# Patient Record
Sex: Female | Born: 1955 | Race: White | Hispanic: No | Marital: Married | Smoking: Never smoker
Health system: Southern US, Community
[De-identification: ages and names within clinical notes are randomized; demographics above are authoritative.]

## PROBLEM LIST (undated history)

## (undated) DIAGNOSIS — D649 Anemia, unspecified: Secondary | ICD-10-CM

## (undated) HISTORY — PX: TUBAL LIGATION: SHX77

## (undated) HISTORY — PX: ENDOMETRIAL ABLATION: SHX621

## (undated) HISTORY — PX: DIAGNOSTIC LAPAROSCOPY: SUR761

## (undated) HISTORY — PX: OVARIAN CYST REMOVAL: SHX89

---

## 2007-03-09 ENCOUNTER — Ambulatory Visit (HOSPITAL_COMMUNITY): Admission: RE | Admit: 2007-03-09 | Discharge: 2007-03-09 | Payer: Self-pay | Admitting: Internal Medicine

## 2007-03-30 ENCOUNTER — Encounter: Admission: RE | Admit: 2007-03-30 | Discharge: 2007-03-30 | Payer: Self-pay | Admitting: Internal Medicine

## 2007-03-30 ENCOUNTER — Encounter (INDEPENDENT_AMBULATORY_CARE_PROVIDER_SITE_OTHER): Payer: Self-pay | Admitting: Diagnostic Radiology

## 2007-07-08 ENCOUNTER — Encounter: Payer: Self-pay | Admitting: Gastroenterology

## 2007-07-08 ENCOUNTER — Ambulatory Visit: Payer: Self-pay | Admitting: Gastroenterology

## 2007-07-08 ENCOUNTER — Ambulatory Visit (HOSPITAL_COMMUNITY): Admission: RE | Admit: 2007-07-08 | Discharge: 2007-07-08 | Payer: Self-pay | Admitting: Gastroenterology

## 2007-08-03 ENCOUNTER — Ambulatory Visit (HOSPITAL_COMMUNITY): Admission: RE | Admit: 2007-08-03 | Discharge: 2007-08-03 | Payer: Self-pay | Admitting: Gastroenterology

## 2007-08-04 ENCOUNTER — Ambulatory Visit: Payer: Self-pay | Admitting: Gastroenterology

## 2007-10-05 ENCOUNTER — Ambulatory Visit (HOSPITAL_COMMUNITY): Admission: RE | Admit: 2007-10-05 | Discharge: 2007-10-05 | Payer: Self-pay | Admitting: Obstetrics and Gynecology

## 2007-10-16 ENCOUNTER — Encounter: Admission: RE | Admit: 2007-10-16 | Discharge: 2007-10-16 | Payer: Self-pay | Admitting: Internal Medicine

## 2008-03-18 ENCOUNTER — Encounter: Admission: RE | Admit: 2008-03-18 | Discharge: 2008-03-18 | Payer: Self-pay | Admitting: Internal Medicine

## 2009-03-22 ENCOUNTER — Ambulatory Visit (HOSPITAL_COMMUNITY): Admission: RE | Admit: 2009-03-22 | Discharge: 2009-03-22 | Payer: Self-pay | Admitting: Internal Medicine

## 2010-02-26 ENCOUNTER — Other Ambulatory Visit: Admission: RE | Admit: 2010-02-26 | Discharge: 2010-02-26 | Payer: Self-pay | Admitting: Obstetrics & Gynecology

## 2010-03-26 ENCOUNTER — Ambulatory Visit (HOSPITAL_COMMUNITY): Admission: RE | Admit: 2010-03-26 | Discharge: 2010-03-26 | Payer: Self-pay | Admitting: Obstetrics & Gynecology

## 2010-09-02 ENCOUNTER — Encounter: Payer: Self-pay | Admitting: Internal Medicine

## 2010-12-25 NOTE — Op Note (Signed)
NAME:  Crystal Duffy, Crystal Duffy              ACCOUNT NO.:  [id number]   MEDICAL RECORD NO.:  [id number]          PATIENT TYPE:  AMB   LOCATION:  DAY                           FACILITY:  [facility]   PHYSICIAN:  Kassie Mends, M.D.      DATE OF BIRTH:  Mar 07, 1956   DATE OF PROCEDURE:  07/08/2007  DATE OF DISCHARGE:                               OPERATIVE REPORT   PROCEDURE:  1. Colonoscopy.  2. Esophagogastroduodenoscopy with cold forceps biopsy.   INDICATION FOR EXAM:  Crystal Duffy is a 55 year old female, who  presents with microcytic anemia.  She denies weight-loss, hematemesis,  melena, or rectal bleeding.  She is not a vegan.  She denies any  dysphagia, heartburn, or abdominal pain.  She rarely uses ibuprofen.  She denies any aspirin use.  She has no family history of colon cancer  or colon polyps.  The colonoscopy is being performed to evaluate her  anemia and the esophagogastroduodenoscopy is being performed only if no  source for her anemia can be found.   FINDINGS:  1. Normal distal terminal ileum and ileocecal valve.  2. Normal colon without evidence of polyps, mass, inflammatory      changes, AVMs or diverticula.  3. Small internal hemorrhoids, otherwise normal retroflexed view of      the rectum.  4. Normal esophagus without evidence of Barrett's, mass, stricture,      erosion or ulceration.  5. Gastric atrophy with lack of prominent gastric folds for a person      who is 55 years old.  Biopsies obtained via cold forceps to      evaluate for atrophic gastritis.  6. Normal duodenal bulb.  The folds of the duodenum appear scalloped.      Biopsies obtained via cold forceps to evaluate for celiac sprue.   DIAGNOSIS:  No obvious source for her microcytic anemia.  The  differential diagnosis includes atrophic gastritis and celiac sprue.   RECOMMENDATIONS:  1. We will check CBC and ferritin after path results known.  2. Will call with the results of her biopsies.  If she has evidence  of      iron deficiency anemia and her biopsies do not reveal an etiology      for her anemia, then a capsule endoscopy will be performed.  3. No aspirin, NSAIDs or anticoagulation for seven days.  4. Screening colonoscopy in ten years.  5. She should follow a high-fiber diet.  She was given a hand-out on      high-fiber diet and hemorrhoids.   MEDICATIONS:  1. Demerol 75 mg IV.  2. Versed 6 mg IV.   PROCEDURE TECHNIQUE:  Physical exam was performed and informed consent  was obtained to the patient.  I explained the benefits, risks and  alternatives to the procedure.  The patient was connected to the monitor  and placed in left lateral position.  Continuous oxygen was provided by  nasal cannula.  Intravenous medicine was administered through an  indwelling cannula.  After administration of sedation and rectal exam,  the patient's rectum was intubated and scope was advanced  under direct  visualization to the distal terminal ileum.  The scope was removed  slowly by careful examining the color, texture, anatomy and integrity of  the mucosa on the way out.   After the colonoscopy, the patient's esophagus was intubated and the  diagnostic gastroscope was used to intubate the esophagus.  The scope  was advanced under direct visualization to the second portion of the  duodenum.  The scope was removed slowly by carefully examining the  color, texture, anatomy and integrity of the mucosa on the way out.  The  patient was recovered in endoscopy and discharged home in satisfactory  condition.      Kassie Mends, M.D.  Electronically Signed     SM/MEDQ  D:  07/08/2007  T:  07/08/2007  Job:  [id number]   cc:   Kingsley Callander. Ouida Sills, MD  Fax: [phone]

## 2010-12-28 NOTE — Op Note (Signed)
NAME:  Crystal Duffy, [name]              ACCOUNT NO.:  [id number]   MEDICAL RECORD NO.:  [id number]          PATIENT TYPE:  AMB   LOCATION:  DAY                           FACILITY:  [facility]   PHYSICIAN:  Kassie Mends, M.D.      DATE OF BIRTH:  1955-11-08   DATE OF PROCEDURE:  08/04/2007  DATE OF DISCHARGE:                               OPERATIVE REPORT   PROCEDURE:  Capsule endoscopy.   INDICATION FOR EXAM:  Ms. Pewitt is a 55 year old female who  presented with iron deficiency anemia.  She had a colonoscopy and  esophagogastroduodenoscopy which showed no obvious etiology for her  anemia.  Her duodenal biopsies were normal.  She had benign gastric  mucosa.  The capsule endoscopy is being performed to complete evaluation  of her GI tract as a source for her iron deficiency anemia.   PROCEDURE DATA:  Height 67 inches, weight 136 pounds, waist 30 inches,  build thin.  Gastric passage time four minutes. Small bowel passage time  4 hours and 34 minutes.   FINDINGS:  Visualization of the gastric mucosa limited by retained  gastric contents.  One small pyloric channel erosion.  Adequate  visualization of the small bowel.  Occasional views obscured by retained  luminal contents.  Otherwise, no masses, erosions, AVMs or strictures  seen.  Limited view of the colonic mucosa due to retained stool.   RECOMMENDATIONS:  Ms. Soliz should continue her iron  supplementation.  It is possible that her iron deficiency anemia is  secondary to menstrual blood loss.  No additional GI workup is required.  Would consider hematology oncology consult if her iron deficiency anemia  persists.      Kassie Mends, M.D.  Electronically Signed     SM/MEDQ  D:  08/04/2007  T:  08/04/2007  Job:  [id number]   cc:   Kingsley Callander. Ouida Sills, MD  Fax: [phone]

## 2012-03-09 ENCOUNTER — Encounter (HOSPITAL_COMMUNITY): Payer: Self-pay | Admitting: Pharmacy Technician

## 2012-03-11 ENCOUNTER — Other Ambulatory Visit: Payer: Self-pay | Admitting: Obstetrics & Gynecology

## 2012-03-11 ENCOUNTER — Encounter (HOSPITAL_COMMUNITY): Payer: Self-pay

## 2012-03-11 ENCOUNTER — Encounter (HOSPITAL_COMMUNITY)
Admission: RE | Admit: 2012-03-11 | Discharge: 2012-03-11 | Disposition: A | Discharge status: Home / Self Care | Payer: BC Managed Care – PPO | Source: Ambulatory Visit | Attending: Obstetrics & Gynecology | Admitting: Obstetrics & Gynecology

## 2012-03-11 HISTORY — DX: Anemia, unspecified: D64.9

## 2012-03-11 LAB — URINALYSIS, ROUTINE W REFLEX MICROSCOPIC
Glucose, UA: NEGATIVE mg/dL
Leukocytes, UA: NEGATIVE
Nitrite: NEGATIVE
Specific Gravity, Urine: >1.03 — ABNORMAL HIGH (ref 1.005–1.030)
Urobilinogen, UA: 0.2 mg/dL (ref 0.0–1.0)

## 2012-03-11 LAB — CBC
HCT: 36.9 % (ref 36.0–46.0)
MCH: 28.2 pg (ref 26.0–34.0)
MCHC: 34.1 g/dL (ref 30.0–36.0)
Platelets: 173 10*3/uL (ref 150–400)
WBC: 6.3 10*3/uL (ref 4.0–10.5)

## 2012-03-11 LAB — COMPREHENSIVE METABOLIC PANEL
ALT: 6 U/L (ref 0–35)
AST: 19 U/L (ref 0–37)
CO2: 28 mEq/L (ref 19–32)
Calcium: 9.7 mg/dL (ref 8.4–10.5)
GFR calc non Af Amer: 73 mL/min — ABNORMAL LOW (ref >90)
Glucose, Bld: 106 mg/dL — ABNORMAL HIGH (ref 70–99)

## 2012-03-11 LAB — SURGICAL PCR SCREEN
MRSA, PCR: NEGATIVE
Staphylococcus aureus: POSITIVE — AB

## 2012-03-11 LAB — URINE MICROSCOPIC-ADD ON

## 2012-03-11 LAB — HCG, QUANTITATIVE, PREGNANCY: hCG, Beta Chain, Quant, S: 1 m[IU]/mL (ref <5)

## 2012-03-11 MED ORDER — KETOROLAC TROMETHAMINE 30 MG/ML IJ SOLN: 30.0000 mg | Freq: Once | INTRAMUSCULAR | Status: DC

## 2012-03-11 NOTE — Patient Instructions (Addendum)
Your procedure is scheduled on:  03/18/12  Report to [facility] at   [place]    AM.  Call this number if you have problems the morning of surgery: [phone]   Remember:   Do not drink or eat food:After Midnight.    Take these medicines the morning of surgery with A SIP OF WATER: none   Do not wear jewelry, make-up or nail polish.  Do not wear lotions, powders, or perfumes. You may wear deodorant.  Do not shave 48 hours prior to surgery. Men may shave face and neck.  Do not bring valuables to the hospital.  Contacts, dentures or bridgework may not be worn into surgery.  Leave suitcase in the car. After surgery it may be brought to your room.  For patients admitted to the hospital, checkout time is 11:00 AM the day of discharge.   Patients discharged the day of surgery will not be allowed to drive home.    Special Instructions: CHG Shower Use Special Wash: 1/2 bottle night before surgery and 1/2 bottle morning of surgery.   Please read over the following fact sheets that you were given: Pain Booklet, MRSA Information, Surgical Site Infection Prevention, Anesthesia Post-op Instructions and Care and Recovery After Surgery   hyHysteroscopy Hysteroscopy is a procedure used for looking inside the womb (uterus). It may be done for many different reasons, including:  To evaluate abnormal bleeding, fibroid (benign, noncancerous) tumors, polyps, scar tissue (adhesions), and possibly cancer of the uterus.   To look for lumps (tumors) and other uterine growths.   To look for causes of why a woman cannot get pregnant (infertility), causes of recurrent loss of pregnancy (miscarriages), or a lost intrauterine device (IUD).   To perform a sterilization by blocking the fallopian tubes from inside the uterus.  A hysteroscopy should be done right after a menstrual period to be sure you are not pregnant. LET YOUR CAREGIVER KNOW ABOUT:   Allergies.   Medicines taken, including herbs, eyedrops,  over-the-counter medicines, and creams.   Use of steroids (by mouth or creams).   Previous problems with anesthetics or numbing medicines.   History of bleeding or blood problems.   History of blood clots.   Possibility of pregnancy, if this applies.   Previous surgery.   Other health problems.  RISKS AND COMPLICATIONS   Putting a hole in the uterus.   Excessive bleeding.   Infection.   Damage to the cervix.   Injury to other organs.   Allergic reaction to medicines.   Too much fluid used in the uterus for the procedure.  BEFORE THE PROCEDURE   Do not take aspirin or blood thinners for a week before the procedure, or as directed. It can cause bleeding.   Arrive at least 60 minutes before the procedure or as directed to read and sign the necessary forms.   Arrange for someone to take you home after the procedure.   If you smoke, do not smoke for 2 weeks before the procedure.  PROCEDURE   Your caregiver may give you medicine to relax you. He or she may also give you a medicine that numbs the area around the cervix (local anesthetic) or a medicine that makes you sleep (general anesthesia).   Sometimes, a medicine is placed in the cervix the day before the procedure. This medicine makes the cervix have a larger opening (dilate). This makes it easier for the instrument to be inserted into the uterus.   A small  instrument (hysteroscope) is inserted through the vagina into the uterus. This instrument is similar to a pencil-sized telescope with a light.   During the procedure, air or a liquid is put into the uterus, which allows the surgeon to see better.   Sometimes, tissue is gently scraped from inside the uterus. These tissue samples are sent to a specialist who looks at tissue samples (pathologist). The pathologist will give a report to your caregiver. This will help your caregiver decide if further treatment is necessary. The report will also help your caregiver decide  on the best treatment if the test comes back abnormal.  AFTER THE PROCEDURE   If you had a general anesthetic, you may be groggy for a couple hours after the procedure.   If you had a local anesthetic, you will be advised to rest at the surgical center or caregiver's office until you are stable and feel ready to go home.   You may have some cramping for a couple days.   You may have bleeding, which varies from light spotting for a few days to menstrual-like bleeding for up to 3 to 7 days. This is normal.   Have someone take you home.  FINDING OUT THE RESULTS OF YOUR TEST Not all test results are available during your visit. If your test results are not back during the visit, make an appointment with your caregiver to find out the results. Do not assume everything is normal if you have not heard from your caregiver or the medical facility. It is important for you to follow up on all of your test results. HOME CARE INSTRUCTIONS   Do not drive for 24 hours or as instructed.   Only take over-the-counter or prescription medicines for pain, discomfort, or fever as directed by your caregiver.   Do not take aspirin. It can cause or aggravate bleeding.   Do not drive or drink alcohol while taking pain medicine.   You may resume your usual diet.   Do not use tampons, douche, or have sexual intercourse for 2 weeks, or as advised by your caregiver.   Rest and sleep for the first 24 to 48 hours.   Take your temperature twice a day for 4 to 5 days. Write it down. Give these temperatures to your caregiver if they are abnormal (above 98.6 F or 37.0 C).   Take medicines your caregiver has ordered as directed.   Follow your caregiver's advice regarding diet, exercise, lifting, driving, and general activities.   Take showers instead of baths for 2 weeks, or as recommended by your caregiver.   If you develop constipation:   Take a mild laxative with the advice of your caregiver.   Eat bran  foods.   Drink enough water and fluids to keep your urine clear or pale yellow.   Try to have someone with you or available to you for the first 24 to 48 hours, especially if you had a general anesthetic.   Make sure you and your family understand everything about your operation and recovery.   Follow your caregiver's advice regarding follow-up appointments and Pap smears.  SEEK MEDICAL CARE IF:   You feel dizzy or lightheaded.   You feel sick to your stomach (nauseous).   You develop abnormal vaginal discharge.   You develop a rash.   You have an abnormal reaction or allergy to your medicine.   You need stronger pain medicine.  SEEK IMMEDIATE MEDICAL CARE IF:   Bleeding is heavier  than a normal menstrual period or you have blood clots.   You have an oral temperature above 102 F (38.9 C), not controlled by medicine.   You have increasing cramps or pains not relieved with medicine.   You develop belly (abdominal) pain that does not seem to be related to the same area of earlier cramping and pain.   You pass out.   You develop pain in the tops of your shoulders (shoulder strap areas).   You develop shortness of breath.  MAKE SURE YOU:   Understand these instructions.   Will watch your condition.   Will get help right away if you are not doing well or get worse.  Document Released: 11/04/2000 Document Revised: 07/18/2011 Document Reviewed: 02/27/2009 [facility] Patient Information 2012 [place], [place].  Endometrial Ablation Endometrial ablation removes the lining of the uterus (endometrium). It is usually a same day, outpatient treatment. Ablation helps avoid major surgery (such as a hysterectomy). A hysterectomy is removal of the cervix and uterus. Endometrial ablation has less risk and complications, has a shorter recovery period and is less expensive. After endometrial ablation, most women will have little or no menstrual bleeding. You may not keep your fertility.  Pregnancy is no longer likely after this procedure but if you are pre-menopausal, you still need to use a reliable method of birth control following the procedure because pregnancy can occur. REASONS TO HAVE THE PROCEDURE MAY INCLUDE:  Heavy periods.   Bleeding that is causing anemia.   Anovulatory bleeding, very irregular, bleeding.   Bleeding submucous fibroids (on the lining inside the uterus) if they are smaller than 3 centimeters.  REASONS NOT TO HAVE THE PROCEDURE MAY INCLUDE:  You wish to have more children.   You have a pre-cancerous or cancerous problem. The cause of any abnormal bleeding must be diagnosed before having the procedure.   You have pain coming from the uterus.   You have a submucus fibroid larger than 3 centimeters.   You recently had a baby.   You recently had an infection in the uterus.   You have a severe retro-flexed, tipped uterus and cannot insert the instrument to do the ablation.   You had a Cesarean section or deep major surgery on the uterus.   The inner cavity of the uterus is too large for the endometrial ablation instrument.  RISKS AND COMPLICATIONS   Perforation of the uterus.   Bleeding.   Infection of the uterus, bladder or vagina.   Injury to surrounding organs.   Cutting the cervix.   An air bubble to the lung (air embolus).   Pregnancy following the procedure.   Failure of the procedure to help the problem requiring hysterectomy.   Decreased ability to diagnose cancer in the lining of the uterus.  BEFORE THE PROCEDURE  The lining of the uterus must be tested to make sure there is no pre-cancerous or cancer cells present.   Medications may be given to make the lining of the uterus thinner.   Ultrasound may be used to evaluate the size and look for abnormalities of the uterus.   Future pregnancy is not desired.  PROCEDURE  There are different ways to destroy the lining of the uterus.   Resectoscope - radio  frequency-alternating electric current is the most common one used.   Cryotherapy - freezing the lining of the uterus.   Heated Free Liquid - heated salt (saline) solution inserted into the uterus.   Microwave - uses high energy microwaves  in the uterus.   Thermal Balloon - a catheter with a balloon tip is inserted into the uterus and filled with heated fluid.  Your caregiver will talk with you about the method used in this clinic. They will also instruct you on the pros and cons of the procedure. Endometrial ablation is performed along with a procedure called operative hysteroscopy. A narrow viewing tube is inserted through the birth canal (vagina) and through the cervix into the uterus. A tiny camera attached to the viewing tube (hysteroscope) allows the uterine cavity to be shown on a TV monitor during surgery. Your uterus is filled with a harmless liquid to make the procedure easier. The lining of the uterus is then removed. The lining can also be removed with a resectoscope which allows your surgeon to cut away the lining of the uterus under direct vision. Usually, you will be able to go home within an hour after the procedure. HOME CARE INSTRUCTIONS   Do not drive for 24 hours.   No tampons, douching or intercourse for 2 weeks or until your caregiver approves.   Rest at home for 24 to 48 hours. You may then resume normal activities unless told differently by your caregiver.   Take your temperature two times a day for 4 days, and record it.   Take any medications your caregiver has ordered, as directed.   Use some form of contraception if you are pre-menopausal and do not want to get pregnant.  Bleeding after the procedure is normal. It varies from light spotting and mildly watery to bloody discharge for 4 to 6 weeks. You may also have mild cramping. Only take over-the-counter or prescription medicines for pain, discomfort, or fever as directed by your caregiver. Do not use aspirin, as  this may aggravate bleeding. Frequent urination during the first 24 hours is normal. You will not know how effective your surgery is until at least 3 months after the surgery. SEEK IMMEDIATE MEDICAL CARE IF:   Bleeding is heavier than a normal menstrual cycle.   An oral temperature above 102 F (38.9 C) develops.   You have increasing cramps or pains not relieved with medication or develop belly (abdominal) pain which does not seem to be related to the same area of earlier cramping and pain.   You are light headed, weak or have fainting episodes.   You develop pain in the shoulder strap areas.   You have chest or leg pain.   You have abnormal vaginal discharge.   You have painful urination.  Document Released: 06/07/2004 Document Revised: 07/18/2011 Document Reviewed: 09/05/2007 [facility] Patient Information 2012 [place], [place]. Instructions Following General Anesthetic, Adult A nurse specialized in giving anesthesia (anesthetist) or a doctor specialized in giving anesthesia (anesthesiologist) gave you a medicine that made you sleep while a procedure was performed. For as long as 24 hours following this procedure, you may feel:  Dizzy.   Weak.   Drowsy.  AFTER THE PROCEDURE After surgery, you will be taken to the recovery area where a nurse will monitor your progress. You will be allowed to go home when you are awake, stable, taking fluids well, and without complications. For the first 24 hours following an anesthetic:  Have a responsible person with you.   Do not drive a car. If you are alone, do not take public transportation.   Do not drink alcohol.   Do not take medicine that has not been prescribed by your caregiver.   Do not sign important  papers or make important decisions.   You may resume normal diet and activities as directed.   Change bandages (dressings) as directed.   Only take over-the-counter or prescription medicines for pain, discomfort, or fever as  directed by your caregiver.  If you have questions or problems that seem related to the anesthetic, call the hospital and ask for the anesthetist or anesthesiologist on call. SEEK IMMEDIATE MEDICAL CARE IF:   You develop a rash.   You have difficulty breathing.   You have chest pain.   You develop any allergic problems.  Document Released: 11/04/2000 Document Revised: 07/18/2011 Document Reviewed: 06/15/2007 [facility] Patient Information 2012 [place], [place].

## 2012-03-18 ENCOUNTER — Encounter (HOSPITAL_COMMUNITY): Payer: Self-pay | Admitting: Anesthesiology

## 2012-03-18 ENCOUNTER — Encounter (HOSPITAL_COMMUNITY)
Admission: RE | Disposition: A | Discharge status: Home / Self Care | Payer: Self-pay | Source: Ambulatory Visit | Attending: Obstetrics & Gynecology

## 2012-03-18 ENCOUNTER — Encounter (HOSPITAL_COMMUNITY): Payer: Self-pay | Admitting: *Deleted

## 2012-03-18 ENCOUNTER — Ambulatory Visit (HOSPITAL_COMMUNITY): Payer: BC Managed Care – PPO | Admitting: Anesthesiology

## 2012-03-18 ENCOUNTER — Ambulatory Visit (HOSPITAL_COMMUNITY)
Admission: RE | Admit: 2012-03-18 | Discharge: 2012-03-18 | Disposition: A | Discharge status: Home / Self Care | Payer: BC Managed Care – PPO | Source: Ambulatory Visit | Attending: Obstetrics & Gynecology | Admitting: Obstetrics & Gynecology

## 2012-03-18 DIAGNOSIS — Z9889 Other specified postprocedural states: Secondary | ICD-10-CM

## 2012-03-18 DIAGNOSIS — N924 Excessive bleeding in the premenopausal period: Secondary | ICD-10-CM | POA: Insufficient documentation

## 2012-03-18 DIAGNOSIS — Z01812 Encounter for preprocedural laboratory examination: Secondary | ICD-10-CM | POA: Insufficient documentation

## 2012-03-18 SURGERY — DILATATION & CURETTAGE/HYSTEROSCOPY WITH THERMACHOICE ABLATION
Anesthesia: General | Wound class: Clean Contaminated

## 2012-03-18 MED ORDER — KETOROLAC TROMETHAMINE 30 MG/ML IJ SOLN
30.0000 mg | Freq: Once | INTRAMUSCULAR | Status: AC
  Administered 2012-03-18: 30 mg via INTRAVENOUS

## 2012-03-18 MED ORDER — PROPOFOL 10 MG/ML IV BOLUS
INTRAVENOUS | Status: DC | PRN
  Administered 2012-03-18: 150 mg via INTRAVENOUS

## 2012-03-18 MED ORDER — CEFAZOLIN SODIUM 1-5 GM-% IV SOLN
INTRAVENOUS | Status: DC | PRN
  Administered 2012-03-18: 2 g via INTRAVENOUS

## 2012-03-18 MED ORDER — MIDAZOLAM HCL 2 MG/2ML IJ SOLN
INTRAMUSCULAR | Status: AC
  Filled 2012-03-18: qty 2

## 2012-03-18 MED ORDER — KETOROLAC TROMETHAMINE 10 MG PO TABS: 10.0000 mg | ORAL_TABLET | Freq: Three times a day (TID) | ORAL | Status: AC | PRN

## 2012-03-18 MED ORDER — HYDROCODONE-ACETAMINOPHEN 5-500 MG PO TABS: 1.0000 | ORAL_TABLET | Freq: Four times a day (QID) | ORAL | Status: AC | PRN

## 2012-03-18 MED ORDER — FENTANYL CITRATE 0.05 MG/ML IJ SOLN
INTRAMUSCULAR | Status: DC | PRN
  Administered 2012-03-18: 50 ug via INTRAVENOUS

## 2012-03-18 MED ORDER — PROPOFOL 10 MG/ML IV EMUL
INTRAVENOUS | Status: AC
  Filled 2012-03-18: qty 20

## 2012-03-18 MED ORDER — ONDANSETRON HCL 4 MG/2ML IJ SOLN
4.0000 mg | Freq: Once | INTRAMUSCULAR | Status: AC
  Administered 2012-03-18: 4 mg via INTRAVENOUS

## 2012-03-18 MED ORDER — FENTANYL CITRATE 0.05 MG/ML IJ SOLN
25.0000 ug | INTRAMUSCULAR | Status: DC | PRN
  Administered 2012-03-18 (×2): 50 ug via INTRAVENOUS

## 2012-03-18 MED ORDER — CEFAZOLIN SODIUM-DEXTROSE 2-3 GM-% IV SOLR
INTRAVENOUS | Status: AC
  Filled 2012-03-18: qty 50

## 2012-03-18 MED ORDER — FENTANYL CITRATE 0.05 MG/ML IJ SOLN
INTRAMUSCULAR | Status: AC
  Filled 2012-03-18: qty 2

## 2012-03-18 MED ORDER — LACTATED RINGERS IV SOLN
INTRAVENOUS | Status: DC
  Administered 2012-03-18: 1000 mL via INTRAVENOUS

## 2012-03-18 MED ORDER — MIDAZOLAM HCL 2 MG/2ML IJ SOLN
1.0000 mg | INTRAMUSCULAR | Status: DC | PRN
  Administered 2012-03-18: 2 mg via INTRAVENOUS

## 2012-03-18 MED ORDER — ONDANSETRON HCL 8 MG PO TABS: 8.0000 mg | ORAL_TABLET | Freq: Three times a day (TID) | ORAL | Status: AC | PRN

## 2012-03-18 MED ORDER — LIDOCAINE HCL (PF) 1 % IJ SOLN
INTRAMUSCULAR | Status: AC
  Filled 2012-03-18: qty 5

## 2012-03-18 MED ORDER — MIDAZOLAM HCL 5 MG/5ML IJ SOLN
INTRAMUSCULAR | Status: DC | PRN
  Administered 2012-03-18: 2 mg via INTRAVENOUS

## 2012-03-18 MED ORDER — ONDANSETRON HCL 4 MG/2ML IJ SOLN: 4.0000 mg | Freq: Once | INTRAMUSCULAR | Status: DC | PRN

## 2012-03-18 MED ORDER — CEFAZOLIN SODIUM-DEXTROSE 2-3 GM-% IV SOLR: 2.0000 g | INTRAVENOUS | Status: DC

## 2012-03-18 MED ORDER — LIDOCAINE HCL 1 % IJ SOLN
INTRAMUSCULAR | Status: DC | PRN
  Administered 2012-03-18: 50 mg via INTRADERMAL

## 2012-03-18 MED ORDER — ONDANSETRON HCL 4 MG/2ML IJ SOLN
INTRAMUSCULAR | Status: AC
  Filled 2012-03-18: qty 2

## 2012-03-18 MED ORDER — SODIUM CHLORIDE 0.9 % IR SOLN
Status: DC | PRN
  Administered 2012-03-18: 3000 mL

## 2012-03-18 MED ORDER — DEXTROSE 5 % IV SOLN
INTRAVENOUS | Status: DC | PRN
  Administered 2012-03-18: 500 mL via INTRAVENOUS

## 2012-03-18 MED ORDER — KETOROLAC TROMETHAMINE 30 MG/ML IJ SOLN
INTRAMUSCULAR | Status: AC
  Filled 2012-03-18: qty 1

## 2012-03-18 SURGICAL SUPPLY — 29 items
BAG DECANTER FOR FLEXI CONT (MISCELLANEOUS) ×2 IMPLANT
BAG HAMPER (MISCELLANEOUS) ×2 IMPLANT
CATH THERMACHOICE III (CATHETERS) ×2 IMPLANT
CLOTH BEACON ORANGE TIMEOUT ST (SAFETY) ×2 IMPLANT
COVER LIGHT HANDLE STERIS (MISCELLANEOUS) ×4 IMPLANT
FORMALIN 10 PREFIL 120ML (MISCELLANEOUS) ×2 IMPLANT
GAUZE SPONGE 4X4 16PLY XRAY LF (GAUZE/BANDAGES/DRESSINGS) ×2 IMPLANT
GLOVE BIOGEL PI IND STRL 7.0 (GLOVE) ×2 IMPLANT
GLOVE BIOGEL PI IND STRL 8 (GLOVE) ×1 IMPLANT
GLOVE BIOGEL PI INDICATOR 7.0 (GLOVE) ×2
GLOVE BIOGEL PI INDICATOR 8 (GLOVE) ×1
GLOVE ECLIPSE 8.0 STRL XLNG CF (GLOVE) ×2 IMPLANT
GLOVE OPTIFIT SS 6.5 STRL BRWN (GLOVE) ×2 IMPLANT
GOWN STRL REIN XL XLG (GOWN DISPOSABLE) ×6 IMPLANT
INST SET HYSTEROSCOPY (KITS) ×2 IMPLANT
IV D5W 500ML (IV SOLUTION) ×2 IMPLANT
IV NS IRRIG 3000ML ARTHROMATIC (IV SOLUTION) ×2 IMPLANT
KIT ROOM TURNOVER APOR (KITS) ×2 IMPLANT
MANIFOLD NEPTUNE II (INSTRUMENTS) ×2 IMPLANT
MARKER SKIN DUAL TIP RULER LAB (MISCELLANEOUS) ×2 IMPLANT
NS IRRIG 1000ML POUR BTL (IV SOLUTION) ×2 IMPLANT
PACK BASIC III (CUSTOM PROCEDURE TRAY) ×1
PACK SRG BSC III STRL LF ECLPS (CUSTOM PROCEDURE TRAY) ×1 IMPLANT
PAD ARMBOARD 7.5X6 YLW CONV (MISCELLANEOUS) ×2 IMPLANT
PAD TELFA 3X4 1S STER (GAUZE/BANDAGES/DRESSINGS) ×2 IMPLANT
SET BASIN LINEN APH (SET/KITS/TRAYS/PACK) ×2 IMPLANT
SET IRRIG Y TYPE TUR BLADDER L (SET/KITS/TRAYS/PACK) ×2 IMPLANT
SHEET LAVH (DRAPES) ×2 IMPLANT
YANKAUER SUCT BULB TIP 10FT TU (MISCELLANEOUS) ×2 IMPLANT

## 2012-03-18 NOTE — Op Note (Signed)
Preoperative diagnosis: 1.  Perimenopausal dysfunctional uterine bleeding  Postoperative diagnoses: Same as above   Procedure: Hysteroscopy, uterine curettage, endometrial ablation  Surgeon: Lazaro Arms   Anesthesia: Laryngeal mask airway  Findings: The endometrium was normal. There were no fibroid or other abnormalities.  Description of operation: The patient was taken to the operating room and placed in the supine position. She underwent general anesthesia using the laryngeal mask airway. She was placed in the dorsal lithotomy position and prepped and draped in the usual sterile fashion. A Graves speculum was placed and the anterior cervical lip was grasped with a single-tooth tenaculum. The cervix was dilated serially to allow passage of the hysteroscope. Diagnostic hysteroscopy was performed and was found to be normal. A vigorous uterine curettage was then performed and all tissue sent to pathology for evaluation. The ThermaChoice 3 endometrial ablation balloon was then used were 14 cc of D5W was required to maintain a pressure of 190-200 mm of mercury throughout the procedure. Toatl therapy time was 9:44.  All of the equipment worked well throughout the procedure. All of the fluid was returned at the end of the procedure. The patient was awakened from anesthesia and taken to the recovery room in good stable condition all counts were correct. She received 2 g of Ancef and 30 mg of Toradol preoperatively. She will be discharged from the recovery room and followed up in the office in 2 weeks.  Jeferson Boozer H 8:28 AM 03/18/2012

## 2012-03-18 NOTE — Anesthesia Postprocedure Evaluation (Signed)
  Anesthesia Post-op Note  Patient: Crystal Duffy  Procedure(s) Performed: Procedure(s) (LRB): DILATATION & CURETTAGE/HYSTEROSCOPY WITH THERMACHOICE ABLATION (N/A)  Patient Location: PACU  Anesthesia Type: General  Level of Consciousness: awake, alert , oriented and patient cooperative  Airway and Oxygen Therapy: Patient Spontanous Breathing  Post-op Pain: 2 /10, mild  Post-op Assessment: Post-op Vital signs reviewed, Patient's Cardiovascular Status Stable, Respiratory Function Stable, Patent Airway, No signs of Nausea or vomiting and Pain level controlled  Post-op Vital Signs: Reviewed and stable  Complications: No apparent anesthesia complications

## 2012-03-18 NOTE — Anesthesia Preprocedure Evaluation (Signed)
Anesthesia Evaluation  Patient identified by MRN, date of birth, ID band Patient awake    Reviewed: Allergy & Precautions, H&P , NPO status , Patient's Chart, lab work & pertinent test results  History of Anesthesia Complications Negative for: history of anesthetic complications  Airway Mallampati: I TM Distance: >3 FB     Dental  (+) Teeth Intact   Pulmonary neg pulmonary ROS,  breath sounds clear to auscultation        Cardiovascular negative cardio ROS  Rhythm:Regular     Neuro/Psych    GI/Hepatic negative GI ROS,   Endo/Other    Renal/GU      Musculoskeletal   Abdominal   Peds  Hematology   Anesthesia Other Findings   Reproductive/Obstetrics                           Anesthesia Physical Anesthesia Plan  ASA: I  Anesthesia Plan: General   Post-op Pain Management:    Induction: Intravenous  Airway Management Planned: LMA  Additional Equipment:   Intra-op Plan:   Post-operative Plan: Extubation in OR  Informed Consent: I have reviewed the patients History and Physical, chart, labs and discussed the procedure including the risks, benefits and alternatives for the proposed anesthesia with the patient or authorized representative who has indicated his/her understanding and acceptance.     Plan Discussed with:   Anesthesia Plan Comments:         Anesthesia Quick Evaluation

## 2012-03-18 NOTE — H&P (Signed)
Crystal Duffy is an 56 y.o. female G 3 P 2 LMP now on Depo Provera plu megestrol therapy in differing combinations over the last 3-4 years to control her bleeding.  At times we have been successful but more than not we have not.  I was hoping at some point she would become menopausal but she has not.  As a result of failed conservative therapy we will proceed with hysteroscopy uterine curettage and endometrial ablation.  Patient's last menstrual period was 03/17/2012.    Past Medical History  Diagnosis Date  . Anemia     Past Surgical History  Procedure Date  . Diagnostic laparoscopy ovarian cyst  . Tubal ligation     History reviewed. No pertinent family history.  Social History:  reports that she has never smoked. She does not have any smokeless tobacco history on file. She reports that she drinks about 2.4 ounces of alcohol per week. She reports that she does not use illicit drugs.  Allergies: No Known Allergies  Prescriptions prior to admission  Medication Sig Dispense Refill  . Multiple Vitamin (MULTIVITAMIN WITH MINERALS) TABS Take 1 tablet by mouth daily.      . medroxyPROGESTERone (DEPO-PROVERA) 150 MG/ML injection Inject 150 mg into the muscle every 3 (three) months.        ROS  Review of Systems  Constitutional: Negative for fever, chills, weight loss, malaise/fatigue and diaphoresis.  HENT: Negative for hearing loss, ear pain, nosebleeds, congestion, sore throat, neck pain, tinnitus and ear discharge.   Eyes: Negative for blurred vision, double vision, photophobia, pain, discharge and redness.  Respiratory: Negative for cough, hemoptysis, sputum production, shortness of breath, wheezing and stridor.   Cardiovascular: Negative for chest pain, palpitations, orthopnea, claudication, leg swelling and PND.  Gastrointestinal: Negative for abdominal pain. Negative for heartburn, nausea, vomiting, diarrhea, constipation, blood in stool and melena.  Genitourinary: Negative  for dysuria, urgency, frequency, hematuria and flank pain.  Musculoskeletal: Negative for myalgias, back pain, joint pain and falls.  Skin: Negative for itching and rash.  Neurological: Negative for dizziness, tingling, tremors, sensory change, speech change, focal weakness, seizures, loss of consciousness, weakness and headaches.  Endo/Heme/Allergies: Negative for environmental allergies and polydipsia. Does not bruise/bleed easily.  Psychiatric/Behavioral: Negative for depression, suicidal ideas, hallucinations, memory loss and substance abuse. The patient is not nervous/anxious and does not have insomnia.      Blood pressure 106/64, pulse 54, temperature 98.2 F (36.8 C), temperature source Oral, resp. rate 13, last menstrual period 03/17/2012, SpO2 98.00%. Physical Exam Physical Exam  Vitals reviewed. Constitutional: She is oriented to person, place, and time. She appears well-developed and well-nourished.  HENT:  Head: Normocephalic and atraumatic.  Right Ear: External ear normal.  Left Ear: External ear normal.  Nose: Nose normal.  Mouth/Throat: Oropharynx is clear and moist.  Eyes: Conjunctivae and EOM are normal. Pupils are equal, round, and reactive to light. Right eye exhibits no discharge. Left eye exhibits no discharge. No scleral icterus.  Neck: Normal range of motion. Neck supple. No tracheal deviation present. No thyromegaly present.  Cardiovascular: Normal rate, regular rhythm, normal heart sounds and intact distal pulses.  Exam reveals no gallop and no friction rub.   No murmur heard. Respiratory: Effort normal and breath sounds normal. No respiratory distress. She has no wheezes. She has no rales. She exhibits no tenderness.  GI: Soft. Bowel sounds are normal. She exhibits no distension and no mass. There is tenderness. There is no rebound and no guarding.  Genitourinary:       Vulva is normal without lesions Vagina is pink moist without discharge Cervix normal in  appearance and pap is normal Uterus is normal size without any endometrial abnormalities. Adnexa is negative with normal sized ovaries by sonogram  Musculoskeletal: Normal range of motion. She exhibits no edema and no tenderness.  Neurological: She is alert and oriented to person, place, and time. She has normal reflexes. She displays normal reflexes. No cranial nerve deficit. She exhibits normal muscle tone. Coordination normal.  Skin: Skin is warm and dry. No rash noted. No erythema. No pallor.  Psychiatric: She has a normal mood and affect. Her behavior is normal. Judgment and thought content normal.   Recent Results (from the past 336 hour(s))  SURGICAL PCR SCREEN   Collection Time   03/11/12  2:10 PM      Component Value Range   MRSA, PCR NEGATIVE  NEGATIVE   Staphylococcus aureus POSITIVE (*) NEGATIVE  CBC   Collection Time   03/11/12  2:10 PM      Component Value Range   WBC 6.3  4.0 - 10.5 K/uL   RBC 4.47  3.87 - 5.11 MIL/uL   Hemoglobin 12.6  12.0 - 15.0 g/dL   HCT 40.9  81.1 - 91.4 %   MCV 82.6  78.0 - 100.0 fL   MCH 28.2  26.0 - 34.0 pg   MCHC 34.1  30.0 - 36.0 g/dL   RDW 78.2  95.6 - 21.3 %   Platelets 173  150 - 400 K/uL  COMPREHENSIVE METABOLIC PANEL   Collection Time   03/11/12  2:10 PM      Component Value Range   Sodium 141  135 - 145 mEq/L   Potassium 4.2  3.5 - 5.1 mEq/L   Chloride 103  96 - 112 mEq/L   CO2 28  19 - 32 mEq/L   Glucose, Bld 106 (*) 70 - 99 mg/dL   BUN 14  6 - 23 mg/dL   Creatinine, Ser 0.86  0.50 - 1.10 mg/dL   Calcium 9.7  8.4 - 57.8 mg/dL   Total Protein 6.6  6.0 - 8.3 g/dL   Albumin 4.1  3.5 - 5.2 g/dL   AST 19  0 - 37 U/L   ALT 6  0 - 35 U/L   Alkaline Phosphatase 64  39 - 117 U/L   Total Bilirubin 0.5  0.3 - 1.2 mg/dL   GFR calc non Af Amer 73 (*) >90 mL/min   GFR calc Af Amer 84 (*) >90 mL/min  URINALYSIS, ROUTINE W REFLEX MICROSCOPIC   Collection Time   03/11/12  2:10 PM      Component Value Range   Color, Urine YELLOW  YELLOW     APPearance HAZY (*) CLEAR   Specific Gravity, Urine >1.030 (*) 1.005 - 1.030   pH 5.5  5.0 - 8.0   Glucose, UA NEGATIVE  NEGATIVE mg/dL   Hgb urine dipstick LARGE (*) NEGATIVE   Bilirubin Urine SMALL (*) NEGATIVE   Ketones, ur NEGATIVE  NEGATIVE mg/dL   Protein, ur TRACE (*) NEGATIVE mg/dL   Urobilinogen, UA 0.2  0.0 - 1.0 mg/dL   Nitrite NEGATIVE  NEGATIVE   Leukocytes, UA NEGATIVE  NEGATIVE  HCG, QUANTITATIVE, PREGNANCY   Collection Time   03/11/12  2:10 PM      Component Value Range   hCG, Beta Chain, Quant, S 1  <5 mIU/mL  URINE MICROSCOPIC-ADD ON   Collection Time  03/11/12  2:10 PM      Component Value Range   Squamous Epithelial / LPF RARE  RARE   WBC, UA 0-2  <3 WBC/hpf   RBC / HPF TOO NUMEROUS TO COUNT  <3 RBC/hpf   Bacteria, UA FEW (*) RARE         Assessment/Plan: 1.  Perimenopausal dysfunctional uterine bleeding  Proceed with endometrial ablation  EURE,LUTHER H 03/18/2012, 7:29 AM

## 2012-03-18 NOTE — Anesthesia Procedure Notes (Signed)
Procedure Name: LMA Insertion Date/Time: 03/18/2012 7:49 AM Performed by: Despina Hidden Pre-anesthesia Checklist: Emergency Drugs available, Patient identified, Suction available and Patient being monitored Patient Re-evaluated:Patient Re-evaluated prior to inductionOxygen Delivery Method: Circle system utilized Preoxygenation: Pre-oxygenation with 100% oxygen Intubation Type: IV induction Ventilation: Mask ventilation without difficulty LMA Size: 3.0 Tube type: Oral Number of attempts: 1 Placement Confirmation: positive ETCO2,  CO2 detector and breath sounds checked- equal and bilateral Tube secured with: Tape Dental Injury: Teeth and Oropharynx as per pre-operative assessment

## 2012-03-18 NOTE — Transfer of Care (Signed)
Immediate Anesthesia Transfer of Care Note  Patient: Crystal Duffy  Procedure(s) Performed: Procedure(s) (LRB): DILATATION & CURETTAGE/HYSTEROSCOPY WITH THERMACHOICE ABLATION (N/A)  Patient Location: PACU  Anesthesia Type: General  Level of Consciousness: awake and patient cooperative  Airway & Oxygen Therapy: Patient Spontanous Breathing and Patient connected to face mask oxygen  Post-op Assessment: Report given to PACU RN, Post -op Vital signs reviewed and stable and Patient moving all extremities  Post vital signs: Reviewed and stable  Complications: No apparent anesthesia complications

## 2013-03-17 ENCOUNTER — Other Ambulatory Visit: Payer: Self-pay | Admitting: Obstetrics & Gynecology

## 2013-03-17 ENCOUNTER — Other Ambulatory Visit (HOSPITAL_COMMUNITY)
Admission: RE | Admit: 2013-03-17 | Discharge: 2013-03-17 | Disposition: A | Discharge status: Home / Self Care | Payer: BC Managed Care – PPO | Source: Ambulatory Visit | Attending: Obstetrics & Gynecology | Admitting: Obstetrics & Gynecology

## 2013-03-17 ENCOUNTER — Ambulatory Visit (INDEPENDENT_AMBULATORY_CARE_PROVIDER_SITE_OTHER): Payer: BC Managed Care – PPO | Admitting: Obstetrics & Gynecology

## 2013-03-17 ENCOUNTER — Encounter: Payer: Self-pay | Admitting: Obstetrics & Gynecology

## 2013-03-17 VITALS — BP 100/60 | Ht 66.0 in | Wt 146.0 lb

## 2013-03-17 DIAGNOSIS — Z01419 Encounter for gynecological examination (general) (routine) without abnormal findings: Secondary | ICD-10-CM

## 2013-03-17 DIAGNOSIS — Z833 Family history of diabetes mellitus: Secondary | ICD-10-CM

## 2013-03-17 DIAGNOSIS — Z1151 Encounter for screening for human papillomavirus (HPV): Secondary | ICD-10-CM | POA: Insufficient documentation

## 2013-03-17 DIAGNOSIS — Z1212 Encounter for screening for malignant neoplasm of rectum: Secondary | ICD-10-CM

## 2013-03-17 DIAGNOSIS — Z09 Encounter for follow-up examination after completed treatment for conditions other than malignant neoplasm: Secondary | ICD-10-CM

## 2013-03-17 NOTE — Patient Instructions (Signed)
Type 2 Diabetes Mellitus, Adult Type 2 diabetes mellitus, often simply referred to as type 2 diabetes, is a long-lasting (chronic) disease. In type 2 diabetes, the pancreas does not make enough insulin (a hormone), the cells are less responsive to the insulin that is made (insulin resistance), or both. Normally, insulin moves sugars from food into the tissue cells. The tissue cells use the sugars for energy. The lack of insulin or the lack of normal response to insulin causes excess sugars to build up in the blood instead of going into the tissue cells. As a result, high blood sugar (hyperglycemia) develops. The effect of high sugar (glucose) levels can cause many complications. Type 2 diabetes was also previously called adult-onset diabetes but it can occur at any age.  RISK FACTORS  A person is predisposed to developing type 2 diabetes if someone in the family has the disease and also has one or more of the following primary risk factors:  Overweight.  An inactive lifestyle.  A history of consistently eating high-calorie foods. Maintaining a normal weight and regular physical activity can reduce the chance of developing type 2 diabetes. SYMPTOMS  A person with type 2 diabetes may not show symptoms initially. The symptoms of type 2 diabetes appear slowly. The symptoms include:  Increased thirst (polydipsia).  Increased urination (polyuria).  Increased urination during the night (nocturia).  Weight loss. This weight loss may be rapid.  Frequent, recurring infections.  Tiredness (fatigue).  Weakness.  Vision changes, such as blurred vision.  Fruity smell to your breath.  Abdominal pain.  Nausea or vomiting.  Cuts or bruises which are slow to heal.  Tingling or numbness in the hands or feet. DIAGNOSIS Type 2 diabetes is frequently not diagnosed until complications of diabetes are present. Type 2 diabetes is diagnosed when symptoms or complications are present and when blood  glucose levels are increased. Your blood glucose level may be checked by one or more of the following blood tests:  A fasting blood glucose test. You will not be allowed to eat for at least 8 hours before a blood sample is taken.  A random blood glucose test. Your blood glucose is checked at any time of the day regardless of when you ate.  A hemoglobin A1c blood glucose test. A hemoglobin A1c test provides information about blood glucose control over the previous 3 months.  An oral glucose tolerance test (OGTT). Your blood glucose is measured after you have not eaten (fasted) for 2 hours and then after you drink a glucose-containing beverage. TREATMENT   You may need to take insulin or diabetes medicine daily to keep blood glucose levels in the desired range.  You will need to match insulin dosing with exercise and healthy food choices. The treatment goal is to maintain the before meal blood sugar (preprandial glucose) level at 70 130 mg/dL. HOME CARE INSTRUCTIONS   Have your hemoglobin A1c level checked twice a year.  Perform daily blood glucose monitoring as directed by your caregiver.  Monitor urine ketones when you are ill and as directed by your caregiver.  Take your diabetes medicine or insulin as directed by your caregiver to maintain your blood glucose levels in the desired range.  Never run out of diabetes medicine or insulin. It is needed every day.  Adjust insulin based on your intake of carbohydrates. Carbohydrates can raise blood glucose levels but need to be included in your diet. Carbohydrates provide vitamins, minerals, and fiber which are an essential part of   a healthy diet. Carbohydrates are found in fruits, vegetables, whole grains, dairy products, legumes, and foods containing added sugars.    Eat healthy foods. Alternate 3 meals with 3 snacks.  Lose weight if overweight.  Carry a medical alert card or wear your medical alert jewelry.  Carry a 15 gram  carbohydrate snack with you at all times to treat low blood glucose (hypoglycemia). Some examples of 15 gram carbohydrate snacks include:  Glucose tablets, 3 or 4   Glucose gel, 15 gram tube  Raisins, 2 tablespoons (24 grams)  Jelly beans, 6  Animal crackers, 8  Regular pop, 4 ounces (120 mL)  Gummy treats, 9  Recognize hypoglycemia. Hypoglycemia occurs with blood glucose levels of 70 mg/dL and below. The risk for hypoglycemia increases when fasting or skipping meals, during or after intense exercise, and during sleep. Hypoglycemia symptoms can include:  Tremors or shakes.  Decreased ability to concentrate.  Sweating.  Increased heart rate.  Headache.  Dry mouth.  Hunger.  Irritability.  Anxiety.  Restless sleep.  Altered speech or coordination.  Confusion.  Treat hypoglycemia promptly. If you are alert and able to safely swallow, follow the 15:15 rule:  Take 15 20 grams of rapid-acting glucose or carbohydrate. Rapid-acting options include glucose gel, glucose tablets, or 4 ounces (120 mL) of fruit juice, regular soda, or low fat milk.  Check your blood glucose level 15 minutes after taking the glucose.  Take 15 20 grams more of glucose if the repeat blood glucose level is still 70 mg/dL or below.  Eat a meal or snack within 1 hour once blood glucose levels return to normal.    Be alert to polyuria and polydipsia which are early signs of hyperglycemia. An early awareness of hyperglycemia allows for prompt treatment. Treat hyperglycemia as directed by your caregiver.  Engage in at least 150 minutes of moderate-intensity physical activity a week, spread over at least 3 days of the week or as directed by your caregiver. In addition, you should engage in resistance exercise at least 2 times a week or as directed by your caregiver.  Adjust your medicine and food intake as needed if you start a new exercise or sport.  Follow your sick day plan at any time you  are unable to eat or drink as usual.  Avoid tobacco use.  Limit alcohol intake to no more than 1 drink per day for nonpregnant women and 2 drinks per day for men. You should drink alcohol only when you are also eating food. Talk with your caregiver whether alcohol is safe for you. Tell your caregiver if you drink alcohol several times a week.  Follow up with your caregiver regularly.  Schedule an eye exam soon after the diagnosis of type 2 diabetes and then annually.  Perform daily skin and foot care. Examine your skin and feet daily for cuts, bruises, redness, nail problems, bleeding, blisters, or sores. A foot exam by a caregiver should be done annually.  Brush your teeth and gums at least twice a day and floss at least once a day. Follow up with your dentist regularly.  Share your diabetes management plan with your workplace or school.  Stay up-to-date with immunizations.  Learn to manage stress.  Obtain ongoing diabetes education and support as needed.  Participate in, or seek rehabilitation as needed to maintain or improve independence and quality of life. Request a physical or occupational therapy referral if you are having foot or hand numbness or difficulties with grooming,   dressing, eating, or physical activity. SEEK MEDICAL CARE IF:   You are unable to eat food or drink fluids for more than 6 hours.  You have nausea and vomiting for more than 6 hours.  Your blood glucose level is over 240 mg/dL.  There is a change in mental status.  You develop an additional serious illness.  You have diarrhea for more than 6 hours.  You have been sick or have had a fever for a couple of days and are not getting better.  You have pain during any physical activity.  SEEK IMMEDIATE MEDICAL CARE IF:  You have difficulty breathing.  You have moderate to large ketone levels. MAKE SURE YOU:  Understand these instructions.  Will watch your condition.  Will get help right away if  you are not doing well or get worse. Document Released: 07/29/2005 Document Revised: 04/22/2012 Document Reviewed: 02/25/2012 [employer] Patient Information 2014 [employer].  

## 2013-03-17 NOTE — Progress Notes (Signed)
Patient ID: Crystal Duffy, female   DOB: 1956-03-13, 57 y.o.   MRN: [id number] Subjective:     Crystal Duffy is a 57 y.o. female here for a routine exam.  No LMP recorded. menopausalNo obstetric history on file. Current complaints: none.  Personal health questionnaire reviewed: no.   Gynecologic History No LMP recorded. Contraception: post menopausal status Last Pap: 2013. Results were: normal Last mammogram: 2013. Results were: normal  Obstetric History OB History   Grav Para Term Preterm Abortions TAB SAB Ect Mult Living                   The following portions of the patient's history were reviewed and updated as appropriate: allergies, current medications, past family history, past medical history, past social history, past surgical history and problem list.  Review of Systems  Review of Systems  Constitutional: Negative for fever, chills, weight loss, malaise/fatigue and diaphoresis.  HENT: Negative for hearing loss, ear pain, nosebleeds, congestion, sore throat, neck pain, tinnitus and ear discharge.   Eyes: Negative for blurred vision, double vision, photophobia, pain, discharge and redness.  Respiratory: Negative for cough, hemoptysis, sputum production, shortness of breath, wheezing and stridor.   Cardiovascular: Negative for chest pain, palpitations, orthopnea, claudication, leg swelling and PND.  Gastrointestinal: negative for abdominal pain. Negative for heartburn, nausea, vomiting, diarrhea, constipation, blood in stool and melena.  Genitourinary: Negative for dysuria, urgency, frequency, hematuria and flank pain.  Musculoskeletal: Negative for myalgias, back pain, joint pain and falls.  Skin: Negative for itching and rash.  Neurological: Negative for dizziness, tingling, tremors, sensory change, speech change, focal weakness, seizures, loss of consciousness, weakness and headaches.  Endo/Heme/Allergies: Negative for environmental allergies and polydipsia. Does not  bruise/bleed easily.  Psychiatric/Behavioral: Negative for depression, suicidal ideas, hallucinations, memory loss and substance abuse. The patient is not nervous/anxious and does not have insomnia.        Objective:    Physical Exam  Vitals reviewed. Constitutional: She is oriented to person, place, and time. She appears well-developed and well-nourished.  HENT:  Head: Normocephalic and atraumatic.        Right Ear: External ear normal.  Left Ear: External ear normal.  Nose: Nose normal.  Mouth/Throat: Oropharynx is clear and moist.  Eyes: Conjunctivae and EOM are normal. Pupils are equal, round, and reactive to light. Right eye exhibits no discharge. Left eye exhibits no discharge. No scleral icterus.  Neck: Normal range of motion. Neck supple. No tracheal deviation present. No thyromegaly present.  Cardiovascular: Normal rate, regular rhythm, normal heart sounds and intact distal pulses.  Exam reveals no gallop and no friction rub.   No murmur heard. Respiratory: Effort normal and breath sounds normal. No respiratory distress. She has no wheezes. She has no rales. She exhibits no tenderness.  GI: Soft. Bowel sounds are normal. She exhibits no distension and no mass. There is no tenderness. There is no rebound and no guarding.  Genitourinary:       Vulva is normal without lesions Vagina is pink moist without discharge Cervix normal in appearance and pap is done Uterus is normal size shape and contour Adnexa is negative with normal sized ovaries   Musculoskeletal: Normal range of motion. She exhibits no edema and no tenderness.  Neurological: She is alert and oriented to person, place, and time. She has normal reflexes. She displays normal reflexes. No cranial nerve deficit. She exhibits normal muscle tone. Coordination normal.  Skin: Skin is warm and dry. No  rash noted. No erythema. No pallor.  Psychiatric: She has a normal mood and affect. Her behavior is normal. Judgment and  thought content normal.       Assessment:    Healthy female exam.    Plan:    Mammogram ordered. Follow up in: 1 year.

## 2013-03-17 NOTE — Addendum Note (Signed)
Addended by: Colen Darling on: 03/17/2013 03:07 PM   Modules accepted: Orders

## 2013-03-18 LAB — HEMOGLOBIN A1C
Hgb A1c MFr Bld: 5.6 % (ref <5.7)
Mean Plasma Glucose: 114 mg/dL (ref <117)

## 2013-03-22 ENCOUNTER — Ambulatory Visit (HOSPITAL_COMMUNITY)
Admission: RE | Admit: 2013-03-22 | Discharge: 2013-03-22 | Disposition: A | Discharge status: Home / Self Care | Payer: BC Managed Care – PPO | Source: Ambulatory Visit | Attending: Obstetrics & Gynecology | Admitting: Obstetrics & Gynecology

## 2013-03-22 DIAGNOSIS — Z1231 Encounter for screening mammogram for malignant neoplasm of breast: Secondary | ICD-10-CM | POA: Insufficient documentation

## 2013-03-22 DIAGNOSIS — Z09 Encounter for follow-up examination after completed treatment for conditions other than malignant neoplasm: Secondary | ICD-10-CM

## 2014-03-18 ENCOUNTER — Encounter: Payer: Self-pay | Admitting: Obstetrics & Gynecology

## 2014-03-18 ENCOUNTER — Ambulatory Visit (INDEPENDENT_AMBULATORY_CARE_PROVIDER_SITE_OTHER): Payer: BC Managed Care – PPO | Admitting: Obstetrics & Gynecology

## 2014-03-18 ENCOUNTER — Other Ambulatory Visit (HOSPITAL_COMMUNITY)
Admission: RE | Admit: 2014-03-18 | Discharge: 2014-03-18 | Disposition: A | Discharge status: Home / Self Care | Payer: BC Managed Care – PPO | Source: Ambulatory Visit | Attending: Obstetrics & Gynecology | Admitting: Obstetrics & Gynecology

## 2014-03-18 ENCOUNTER — Other Ambulatory Visit: Payer: Self-pay | Admitting: Obstetrics & Gynecology

## 2014-03-18 VITALS — BP 100/70 | Ht 67.0 in | Wt 146.0 lb

## 2014-03-18 DIAGNOSIS — Z01419 Encounter for gynecological examination (general) (routine) without abnormal findings: Secondary | ICD-10-CM | POA: Insufficient documentation

## 2014-03-18 DIAGNOSIS — Z1212 Encounter for screening for malignant neoplasm of rectum: Secondary | ICD-10-CM

## 2014-03-18 DIAGNOSIS — Z1231 Encounter for screening mammogram for malignant neoplasm of breast: Secondary | ICD-10-CM

## 2014-03-18 NOTE — Progress Notes (Signed)
Patient ID: Crystal Duffy, female   DOB: [birth year], 58 y.o.   MRN: [id number] Subjective:     Crystal Duffy is a 58 y.o. female here for a routine exam.  No LMP recorded. Patient has had an ablation. No obstetric history on file. Birth Control Method:  BTL Menstrual Calendar(currently): infrequent spotting  Current complaints: none.   Current acute medical issues:  none   Recent Gynecologic History No LMP recorded. Patient has had an ablation. Last Pap: 2014,  normal Last mammogram: 03/23/2013,  normal  Past Medical History  Diagnosis Date  . Anemia     Past Surgical History  Procedure Laterality Date  . Diagnostic laparoscopy  ovarian cyst  . Tubal ligation    endometrial ablation  OB History   Grav Para Term Preterm Abortions TAB SAB Ect Mult Living                  History   Social History  . Marital Status: Married    Spouse Name: N/A    Number of Children: N/A  . Years of Education: N/A   Social History Main Topics  . Smoking status: Never Smoker   . Smokeless tobacco: None  . Alcohol Use: 2.4 oz/week    2 Glasses of wine, 2 Cans of beer per week  . Drug Use: No  . Sexual Activity: Yes    Birth Control/ Protection: Injection   Other Topics Concern  . None   Social History Narrative  . None    Family History  Problem Relation Age of Onset  . Diabetes Father   . Diabetes Brother   . Diabetes Brother      Review of Systems  Review of Systems  Constitutional: Negative for fever, chills, weight loss, malaise/fatigue and diaphoresis.  HENT: Negative for hearing loss, ear pain, nosebleeds, congestion, sore throat, neck pain, tinnitus and ear discharge.   Eyes: Negative for blurred vision, double vision, photophobia, pain, discharge and redness.  Respiratory: Negative for cough, hemoptysis, sputum production, shortness of breath, wheezing and stridor.   Cardiovascular: Negative for chest pain, palpitations, orthopnea, claudication, leg swelling  and PND.  Gastrointestinal: negative for abdominal pain. Negative for heartburn, nausea, vomiting, diarrhea, constipation, blood in stool and melena.  Genitourinary: Negative for dysuria, urgency, frequency, hematuria and flank pain.  Musculoskeletal: Negative for myalgias, back pain, joint pain and falls.  Skin: Negative for itching and rash.  Neurological: Negative for dizziness, tingling, tremors, sensory change, speech change, focal weakness, seizures, loss of consciousness, weakness and headaches.  Endo/Heme/Allergies: Negative for environmental allergies and polydipsia. Does not bruise/bleed easily.  Psychiatric/Behavioral: Negative for depression, suicidal ideas, hallucinations, memory loss and substance abuse. The patient is not nervous/anxious and does not have insomnia.        Objective:    Physical Exam  Vitals reviewed. Constitutional: She is oriented to person, place, and time. She appears well-developed and well-nourished.  HENT:  Head: Normocephalic and atraumatic.        Right Ear: External ear normal.  Left Ear: External ear normal.  Nose: Nose normal.  Mouth/Throat: Oropharynx is clear and moist.  Eyes: Conjunctivae and EOM are normal. Pupils are equal, round, and reactive to light. Right eye exhibits no discharge. Left eye exhibits no discharge. No scleral icterus.  Neck: Normal range of motion. Neck supple. No tracheal deviation present. No thyromegaly present.  Cardiovascular: Normal rate, regular rhythm, normal heart sounds and intact distal pulses.  Exam reveals no gallop and no  friction rub.   No murmur heard. Respiratory: Effort normal and breath sounds normal. No respiratory distress. She has no wheezes. She has no rales. She exhibits no tenderness.  GI: Soft. Bowel sounds are normal. She exhibits no distension and no mass. There is no tenderness. There is no rebound and no guarding.  Genitourinary:  Breasts no masses skin changes or nipple changes bilaterally       Vulva is normal without lesions Vagina is pink moist without discharge Cervix normal in appearance and pap is done Uterus is normal size shape and contour Adnexa is negative with normal sized ovaries  Rectal    hemoccult negative, normal tone, no masses  Musculoskeletal: Normal range of motion. She exhibits no edema and no tenderness.  Neurological: She is alert and oriented to person, place, and time. She has normal reflexes. She displays normal reflexes. No cranial nerve deficit. She exhibits normal muscle tone. Coordination normal.  Skin: Skin is warm and dry. No rash noted. No erythema. No pallor.  Psychiatric: She has a normal mood and affect. Her behavior is normal. Judgment and thought content normal.       Assessment:    Healthy female exam.    Plan:    Mammogram ordered. Follow up in: 1 year.

## 2014-03-22 LAB — CYTOLOGY - PAP

## 2014-03-24 ENCOUNTER — Ambulatory Visit (HOSPITAL_COMMUNITY)
Admission: RE | Admit: 2014-03-24 | Discharge: 2014-03-24 | Disposition: A | Discharge status: Home / Self Care | Payer: BC Managed Care – PPO | Source: Ambulatory Visit | Attending: Obstetrics & Gynecology | Admitting: Obstetrics & Gynecology

## 2014-03-24 DIAGNOSIS — Z1231 Encounter for screening mammogram for malignant neoplasm of breast: Secondary | ICD-10-CM | POA: Insufficient documentation

## 2015-03-07 ENCOUNTER — Other Ambulatory Visit: Payer: Self-pay | Admitting: Obstetrics & Gynecology

## 2015-03-07 DIAGNOSIS — Z1231 Encounter for screening mammogram for malignant neoplasm of breast: Secondary | ICD-10-CM

## 2015-03-29 ENCOUNTER — Ambulatory Visit (HOSPITAL_COMMUNITY)
Admission: RE | Admit: 2015-03-29 | Discharge: 2015-03-29 | Disposition: A | Discharge status: Home / Self Care | Payer: BC Managed Care – PPO | Source: Ambulatory Visit | Attending: Obstetrics & Gynecology | Admitting: Obstetrics & Gynecology

## 2015-03-29 DIAGNOSIS — Z1231 Encounter for screening mammogram for malignant neoplasm of breast: Secondary | ICD-10-CM

## 2016-09-16 ENCOUNTER — Ambulatory Visit (INDEPENDENT_AMBULATORY_CARE_PROVIDER_SITE_OTHER): Payer: BC Managed Care – PPO | Admitting: Pulmonary Disease

## 2016-09-16 ENCOUNTER — Encounter: Payer: Self-pay | Admitting: Pulmonary Disease

## 2016-09-16 VITALS — BP 106/64 | HR 76 | Ht 67.0 in | Wt 149.2 lb

## 2016-09-16 DIAGNOSIS — G4710 Hypersomnia, unspecified: Secondary | ICD-10-CM | POA: Diagnosis not present

## 2016-09-16 NOTE — Assessment & Plan Note (Signed)
She has snoring at times, no witnessed apneas, occasional gasping or choking, sleeps 7 hrs per night during weekday.  She oversleeps by an hour weekends. Occasional waking up in the night. Has hypersomnia on waking up. 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 She lives in [place] and works in [place].   ESS 20.   What is interesting with her is she has been sleepy all her life. She has had hypersomnia may be it's gotten worse the last several months. She had brief episodes of insomnia in December but that improved since with the Christmas break from school, she is able to sleep longer.  Denies hypnagogic or hypnopompic hallucinations. Denies sleep paralysis.  Plan : 1. I think it is imperative to rule out obstructive sleep apnea in this patient. She is not overweight. She has mild retrognathia and possibly posterior area collapse causing her to have apneic Zosyn be symptomatic. She has 3 brothers who looks like her albeit heavier, and all 3 brothers have sleep apnea and uses CPAP machine. Plan for a lab sleep study. 2. 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 No symptoms suggestive of narcolepsy. She may end up needing an MSLT. Denies sleep paralysis, hallucinations.  3. She had episodes of waking up couple times like clockwork during the holidays. She can have some depression or anxiety. She denies that however. May need to look into this if the sleep study is negative.  4. May need modafinil if the sleep studies negative. Very symptomatic at work, especially at 3 PM after school.

## 2016-09-16 NOTE — Progress Notes (Signed)
Subjective:    Patient ID: Crystal Duffy, female    DOB: 03-31-1956, 61 y.o.   MRN: [id number]  HPI   This is the case of Crystal Duffy, 61 y.o. Female, who was referred by Dr. Asencion Noble  in consultation regarding hypersomnia.   As you very well know, patient is a non smoker, not known to have asthma or copd.  She has snoring at times, no witnessed apneas, occasional gasping or choking, sleeps 7 hrs per night during weekday.  She oversleeps by an hour weekends. Occasional waking up in the night. Has hypersomnia on waking up. 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 She lives in [place] and works in [place].   ESS 20.   What is interesting with her is she has been sleepy all her life. She has had hypersomnia may be it's gotten worse the last several months. She had brief episodes of insomnia in December but that improved since with the Christmas break from school, she is able to sleep longer.  Denies hypnagogic or hypnopompic hallucinations. Denies sleep paralysis.  Review of Systems  Constitutional: Negative.   HENT: Negative.   Eyes: Negative.   Respiratory: Negative.   Cardiovascular: Positive for palpitations.  Gastrointestinal: Negative.   Genitourinary: Negative.   Musculoskeletal: Negative.   Skin: Negative.   Neurological: Negative.   Hematological: Negative.   Psychiatric/Behavioral: Negative.  The patient is not hyperactive.    Past Medical History:  Diagnosis Date  . Anemia    (-) CAD, DM, CA, DVT  Family History  Problem Relation Age of Onset  . Diabetes Father   . Diabetes Brother   . Diabetes Brother      Past Surgical History:  Procedure Laterality Date  . DIAGNOSTIC LAPAROSCOPY  ovarian cyst  . TUBAL LIGATION      Social History   Social History  . Marital status: Married    Spouse name: N/A  . Number of children: N/A  . Years of education: N/A   Occupational History  . Not on file.    Social History Main Topics  . Smoking status: Never Smoker  . Smokeless tobacco: Never Used  . Alcohol use 2.4 oz/week    2 Glasses of wine, 2 Cans of beer per week  . Drug use: No  . Sexual activity: Yes    Birth control/ protection: Injection   Other Topics Concern  . Not on file   Social History Narrative  . 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   No Known Allergies   Outpatient Medications Prior to Visit  Medication Sig Dispense Refill  . ibuprofen (ADVIL,MOTRIN) 200 MG tablet Take 200 mg by mouth every 6 (six) hours as needed for pain.    . Multiple Vitamin (MULTIVITAMIN WITH MINERALS) TABS Take 1 tablet by mouth daily.    . cetirizine (ZYRTEC) 5 MG tablet Take 5 mg by mouth daily.     No facility-administered medications prior to visit.    No orders of the defined types were placed in this encounter.        Objective:   Physical Exam  Vitals:  Vitals:   09/16/16 1605  BP: 106/64  Pulse: 76  SpO2: 99%  Weight: 149 lb 3.2 oz (67.7 kg)  Height: 5\' 7"  (1.702 m)    Constitutional/General:  Pleasant, well-nourished, well-developed, not in any distress,  Comfortably seating.  Well kempt  Body mass index is 23.37 kg/m. Wt Readings from Last 3 Encounters:  09/16/16 149 lb 3.2 oz (67.7 kg)  03/18/14 146 lb (66.2 kg)  03/17/13 146 lb (66.2 kg)     HEENT: Pupils equal and reactive to light and accommodation. Anicteric sclerae. Normal nasal mucosa.   No oral  lesions,  mouth clear,  oropharynx clear, no postnasal drip. (-) Oral thrush. No dental caries.  Airway - Mallampati class III  Neck: No masses. Midline trachea. No JVD, (-) LAD. (-) bruits appreciated.  Respiratory/Chest: Grossly normal chest. (-) deformity. (-) Accessory muscle use.  Symmetric expansion. (-) Tenderness on palpation.  Resonant on percussion.  Diminished BS on both lower lung zones. (-) wheezing, crackles, rhonchi (-) egophony  Cardiovascular:  Regular rate and  rhythm, heart sounds normal, no murmur or gallops, no peripheral edema  Gastrointestinal:  Normal bowel sounds. Soft, non-tender. No hepatosplenomegaly.  (-) masses.   Musculoskeletal:  Normal muscle tone. Normal gait.   Extremities: Grossly normal. (-) clubbing, cyanosis.  (-) edema  Skin: (-) rash,lesions seen.   Neurological/Psychiatric : alert, oriented to time, place, person. Normal mood and affect         Assessment & Plan:  Hypersomnia She has snoring at times, no witnessed apneas, occasional gasping or choking, sleeps 7 hrs per night during weekday.  She oversleeps by an hour weekends. Occasional waking up in the night. Has hypersomnia on waking up. 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 She lives in [place] and works in [place].   ESS 20.   What is interesting with her is she has been sleepy all her life. She has had hypersomnia may be it's gotten worse the last several months. She had brief episodes of insomnia in December but that improved since with the Christmas break from school, she is able to sleep longer.  Denies hypnagogic or hypnopompic hallucinations. Denies sleep paralysis.  Plan : 1. I think it is imperative to rule out obstructive sleep apnea in this patient. She is not overweight. She has mild retrognathia and possibly posterior area collapse causing her to have apneic Zosyn be symptomatic. She has 3 brothers who looks like her albeit heavier, and all 3 brothers have sleep apnea and uses CPAP machine. Plan for a lab sleep study. 2. 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 No symptoms suggestive of narcolepsy. She may end up needing an MSLT. Denies sleep paralysis, hallucinations.  3. She had episodes of waking up couple times like clockwork during the holidays. She can have some depression or anxiety. She denies that however. May need to look into this if the sleep  study is negative.  4. May need modafinil if the sleep studies negative. Very symptomatic at work, especially at 3 PM after school.      Thank you very much for letting me participate in this patient's care. Please do not hesitate to give me a call if you have any questions or concerns regarding the treatment plan.   Patient will follow up with me in 8-10 weeks.     Monica Becton, MD 09/16/2016   5:50 PM [facility] Pager: [phone] Office: [phone], Fax: [phone]

## 2016-09-16 NOTE — Patient Instructions (Signed)
It was a pleasure taking care of you today!  We will schedule you to have a sleep study to determine if you have sleep apnea.     We will get a lab sleep study.  You will be scheduled to have a lab sleep study in 4-6 weeks.  Someone from the sleep lab will call you in 2-3 days to schedule the study with you.  They usually have cancellations every night so most likely, they will have openings for a lab sleep study next week or so.  We encourage you to do your sleep study then if possible. Please give us a call in a week is no one from the sleep lab calls you in 2-3 days.   If the sleep study is positive, we will order you a CPAP  machine.  Please call the office if you do NOT receive your machine in the next 1-2 weeks.   Please make sure you use your CPAP device everytime you sleep.  We will monitor the usage of your machine per your insurance requirement.  Your insurance company may take the machine from you if you are not using it regularly.   Please clean the mask, tubings, filter, water reservoir with soapy water every week.  Please use distilled water for the water reservoir.   Please call the office or your machine provider (DME company) if you are having issues with the device.   Return to clinic in 8-10 weeks with Dr. De Dios or NP    

## 2017-06-25 ENCOUNTER — Encounter: Payer: Self-pay | Admitting: Gastroenterology

## 2019-02-22 ENCOUNTER — Other Ambulatory Visit: Payer: BC Managed Care – PPO | Admitting: Obstetrics & Gynecology

## 2019-03-04 ENCOUNTER — Telehealth: Payer: Self-pay | Admitting: Obstetrics and Gynecology

## 2019-03-04 NOTE — Telephone Encounter (Signed)

## 2019-03-05 ENCOUNTER — Other Ambulatory Visit (HOSPITAL_COMMUNITY)
Admission: RE | Admit: 2019-03-05 | Discharge: 2019-03-05 | Disposition: A | Discharge status: Home / Self Care | Payer: BC Managed Care – PPO | Source: Ambulatory Visit | Attending: Obstetrics and Gynecology | Admitting: Obstetrics and Gynecology

## 2019-03-05 ENCOUNTER — Other Ambulatory Visit: Payer: Self-pay

## 2019-03-05 ENCOUNTER — Ambulatory Visit (INDEPENDENT_AMBULATORY_CARE_PROVIDER_SITE_OTHER): Payer: BC Managed Care – PPO | Admitting: Obstetrics and Gynecology

## 2019-03-05 ENCOUNTER — Encounter: Payer: Self-pay | Admitting: Obstetrics and Gynecology

## 2019-03-05 VITALS — BP 100/59 | HR 63 | Ht 67.0 in | Wt 147.6 lb

## 2019-03-05 DIAGNOSIS — N816 Rectocele: Secondary | ICD-10-CM

## 2019-03-05 DIAGNOSIS — N838 Other noninflammatory disorders of ovary, fallopian tube and broad ligament: Secondary | ICD-10-CM

## 2019-03-05 DIAGNOSIS — Z01419 Encounter for gynecological examination (general) (routine) without abnormal findings: Secondary | ICD-10-CM

## 2019-03-05 NOTE — Progress Notes (Signed)
Patient ID: Crystal Duffy, female   DOB: 1956-01-03, 63 y.o.   MRN: [id number]  Assessment:  Annual Gyn Exam Left ovarian mass, new finding suspected mild Raynaud's phenomenon Rectocele  Plan:  1. pap smear done, next pap due 3 years 2. Schedule TV u/s diagnose cyst on ovary, also obtain Ca1 25 3. Return telemetry visit 1 week or so 4. Annual mammogram advised after age 10 Subjective:  Crystal Duffy is a 63 y.o. female No obstetric history on file. who presents for annual exam. No LMP recorded. Patient has had an ablation.  She is postmenopausal, on no hormone therapy she has no GYN complaints other than some mild constipation.  She does have to rock side to side to complete defecation at times.  She has had no impactions and has not had to rely on enemas.  She uses prunes or grapes for stool softening She has noticed that her fingers and toes at times become completely blanched and mottled.  She does not experience pain  The patient has complaints today of none. Hands and toes get cold often but not to the point of painful. She notices that her arms are numb when she wakes up in the morning.  The following portions of the patient's history were reviewed and updated as appropriate: allergies, current medications, past family history, past medical history, past social history, past surgical history and problem list. Past Medical History:  Diagnosis Date  . Anemia     Past Surgical History:  Procedure Laterality Date  . DIAGNOSTIC LAPAROSCOPY  ovarian cyst  . TUBAL LIGATION       Current Outpatient Medications:  .  ibuprofen (ADVIL,MOTRIN) 200 MG tablet, Take 200 mg by mouth every 6 (six) hours as needed for pain., Disp: , Rfl:  .  Multiple Vitamin (MULTIVITAMIN WITH MINERALS) TABS, Take 1 tablet by mouth daily., Disp: , Rfl:   Review of Systems Constitutional: negative Gastrointestinal: negative Genitourinary: normal  Objective:  There were no vitals taken for this visit.    BMI: There is no height or weight on file to calculate BMI.  General Appearance: Alert, appropriate appearance for age. No acute distress HEENT: Grossly normal Neck / Thyroid:  Cardiovascular: RRR; normal S1, S2, no murmur Lungs: CTA bilaterally Back: No CVAT Breast Exam: No masses or nodes.No dimpling, nipple retraction or discharge. Gastrointestinal: Soft, non-tender, no masses or organomegaly Pelvic Exam:  VAGINA: normal appearing vagina, CERVIX: normal appearing cervix without discharge or lesions UTERUS: uterus is normal,  4 to 5 cm left cystic structure behind uterus smooth mobile nontender RECTAL: rectocele, guaiac negative stool obtained  PAP: Pap smear done today. Lymphatic Exam: Non-palpable nodes in neck, clavicular, axillary, or inguinal regions  Skin: no rash or abnormalities Neurologic: Normal gait and speech, no tremor  Psychiatric: Alert and oriented, appropriate affect. Extremities: Right foot third toe is completely white and blanched, cold to the touch but not painful and it spontaneously resolved during the course of the visit.  She has similar experiences with her hands and fingers Urinalysis:Not done  By signing my name below, I, Crystal Duffy, attest that this documentation has been prepared under the direction and in the presence of [name], MD. Electronically Signed: [place]. 03/05/19. 11:32 AM.  I personally performed the services described in this documentation, which was SCRIBED in my presence. The recorded information has been reviewed and considered accurate. It has been edited as necessary during review. [name], MD

## 2019-03-09 ENCOUNTER — Telehealth: Payer: Self-pay | Admitting: Obstetrics and Gynecology

## 2019-03-09 ENCOUNTER — Other Ambulatory Visit (HOSPITAL_COMMUNITY): Payer: Self-pay | Admitting: Internal Medicine

## 2019-03-09 DIAGNOSIS — Z1231 Encounter for screening mammogram for malignant neoplasm of breast: Secondary | ICD-10-CM

## 2019-03-09 LAB — CA 125: Cancer Antigen (CA) 125: 10.2 U/mL (ref 0.0–38.1)

## 2019-03-09 LAB — CYTOLOGY - PAP
Diagnosis: NEGATIVE
HPV: NOT DETECTED

## 2019-03-09 NOTE — Telephone Encounter (Signed)
Patient aware that Ca1 25 came back at 10, within normal limits

## 2019-03-10 ENCOUNTER — Ambulatory Visit (HOSPITAL_COMMUNITY)
Admission: RE | Admit: 2019-03-10 | Discharge: 2019-03-10 | Disposition: A | Discharge status: Home / Self Care | Payer: BC Managed Care – PPO | Source: Ambulatory Visit | Attending: Obstetrics and Gynecology | Admitting: Obstetrics and Gynecology

## 2019-03-10 ENCOUNTER — Other Ambulatory Visit: Payer: Self-pay

## 2019-03-10 DIAGNOSIS — N838 Other noninflammatory disorders of ovary, fallopian tube and broad ligament: Secondary | ICD-10-CM | POA: Diagnosis not present

## 2019-03-11 ENCOUNTER — Telehealth: Payer: Self-pay | Admitting: Obstetrics and Gynecology

## 2019-03-11 DIAGNOSIS — N838 Other noninflammatory disorders of ovary, fallopian tube and broad ligament: Secondary | ICD-10-CM

## 2019-03-11 NOTE — Telephone Encounter (Signed)

## 2019-03-11 NOTE — Telephone Encounter (Signed)
Patient aware of the benign appearing ovarian cyst , 7 cm diameter, and the low Ca 125. Options discussed, pt will want laparoscopic bilateral salpingoophorectomy ,to be scheduled in  August as schedules permit.  Will have office scheduler, Celso Amy coordinate.

## 2019-03-12 ENCOUNTER — Ambulatory Visit: Payer: BC Managed Care – PPO | Admitting: Obstetrics and Gynecology

## 2019-03-15 ENCOUNTER — Telehealth: Payer: Self-pay | Admitting: *Deleted

## 2019-03-15 NOTE — Telephone Encounter (Signed)
DR Glo Herring asked me to call pt and schedule Surgery. She is having Lap Bilateral Salpingoophorectomy on 9-1 and need to know how long she was looking to be out after surgery. She thought he had said a couple of days but not sure.  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 Please call pt and advise she she can discuss with her employer

## 2019-03-18 NOTE — Telephone Encounter (Signed)
Patient informed she would be out of work for about a week but if feeling well enough Dr. Glo Herring may let her return sooner.   Patient states that Dr. Glo Herring had mentioned doing something to help with tightening her bowel while he was doing this surgery. She wondered if he still plans to do that and if it would change her recovery time. Advised I would send message to provider and let her know.

## 2019-03-19 ENCOUNTER — Other Ambulatory Visit: Payer: Self-pay

## 2019-03-19 ENCOUNTER — Ambulatory Visit (HOSPITAL_COMMUNITY)
Admission: RE | Admit: 2019-03-19 | Discharge: 2019-03-19 | Disposition: A | Discharge status: Home / Self Care | Payer: BC Managed Care – PPO | Source: Ambulatory Visit | Attending: Internal Medicine | Admitting: Internal Medicine

## 2019-03-19 DIAGNOSIS — Z1231 Encounter for screening mammogram for malignant neoplasm of breast: Secondary | ICD-10-CM

## 2019-03-22 ENCOUNTER — Telehealth: Payer: Self-pay | Admitting: Obstetrics and Gynecology

## 2019-03-22 NOTE — Telephone Encounter (Signed)
Unable to reach pt at cell or home. Work number out of date. Left message on home phone to call me at office to discuss adding posterior repair to scheduled surgery for 04/13/19.

## 2019-03-23 NOTE — Telephone Encounter (Signed)
Pt returning your call. Please call cell.

## 2019-03-24 ENCOUNTER — Telehealth: Payer: Self-pay | Admitting: Obstetrics and Gynecology

## 2019-03-24 NOTE — Telephone Encounter (Signed)
Left msg for pt to call office.

## 2019-03-24 NOTE — Telephone Encounter (Signed)
Pt is only minimally symptomatic at times with the rectocele, so after discussion, she does NOT wish to address Rectocele at this time, We will proceed with laparoscopic removal of ovaries and tubes only.

## 2019-04-06 NOTE — Patient Instructions (Signed)
Crystal Duffy  04/06/2019     @PREFPERIOPPHARMACY @   Your procedure is scheduled on  04/13/2019 .  Report to [facility] at  [phone]  A.M.  Call this number if you have problems the morning of surgery:  [phone]   Remember:  Do not eat or drink after midnight.                       Take these medicines the morning of surgery with A SIP OF WATER None    Do not wear jewelry, make-up or nail polish.  Do not wear lotions, powders, or perfumes. Please wear deodorant and brush your teeth.  Do not shave 48 hours prior to surgery.  Men may shave face and neck.  Do not bring valuables to the hospital.  [facility] is not responsible for any belongings or valuables.  Contacts, dentures or bridgework may not be worn into surgery.  Leave your suitcase in the car.  After surgery it may be brought to your room.  For patients admitted to the hospital, discharge time will be determined by your treatment team.  Patients discharged the day of surgery will not be allowed to drive home.   Name and phone number of your driver:   family Special instructions:  None  Please read over the following fact sheets that you were given. Anesthesia Post-op Instructions and Care and Recovery After Surgery       Diagnostic Laparoscopy, Care After This sheet gives you information about how to care for yourself after your procedure. Your health care provider may also give you more specific instructions. If you have problems or questions, contact your health care provider. What can I expect after the procedure? After the procedure, it is common to have:  Mild discomfort in the abdomen.  Sore throat. Women who have laparoscopy with pelvic examination may have mild cramping and fluid coming from the vagina for a few days after the procedure. Follow these instructions at home: Medicines  Take over-the-counter and prescription medicines only as told by your health care provider.  If  you were prescribed an antibiotic medicine, take it as told by your health care provider. Do not stop taking the antibiotic even if you start to feel better. Driving  Do not drive for 24 hours if you were given a medicine to help you relax (sedative) during your procedure.  Do not drive or use heavy machinery while taking prescription pain medicine. Bathing  Do not take baths, swim, or use a hot tub until your health care provider approves. You may take showers. Incision care   Follow instructions from your health care provider about how to take care of your incisions. Make sure you: ? Wash your hands with soap and water before you change your bandage (dressing). If soap and water are not available, use hand sanitizer. ? Change your dressing as told by your health care provider. ? Leave stitches (sutures), skin glue, or adhesive strips in place. These skin closures may need to stay in place for 2 weeks or longer. If adhesive strip edges start to loosen and curl up, you may trim the loose edges. Do not remove adhesive strips completely unless your health care provider tells you to do that.  Check your incision areas every day for signs of infection. Check for: ? Redness, swelling, or pain. ? Fluid or blood. ? Warmth. ? Pus or a  bad smell. Activity  Return to your normal activities as told by your health care provider. Ask your health care provider what activities are safe for you.  Do not lift anything that is heavier than 10 lb (4.5 kg), or the limit that you are told, until your health care provider says that it is safe. General instructions  To prevent or treat constipation while you are taking prescription pain medicine, your health care provider may recommend that you: ? Drink enough fluid to keep your urine pale yellow. ? Take over-the-counter or prescription medicines. ? Eat foods that are high in fiber, such as fresh fruits and vegetables, whole grains, and beans. ? Limit  foods that are high in fat and processed sugars, such as fried and sweet foods.  Do not use any products that contain nicotine or tobacco, such as cigarettes and e-cigarettes. If you need help quitting, ask your health care provider.  Keep all follow-up visits as told by your health care provider. This is important. Contact a health care provider if:  You develop shoulder pain.  You feel lightheaded or faint.  You are unable to pass gas or have a bowel movement.  You feel nauseous or you vomit.  You develop a rash.  You have redness, swelling, or pain around any incision.  You have fluid or blood coming from any incision.  Any incision feels warm to the touch.  You have pus or a bad smell coming from any incision.  You have a fever or chills. Get help right away if:  You have severe pain.  You have vomiting that does not go away.  You have heavy bleeding from the vagina.  Any incision opens.  You have trouble breathing.  You have chest pain. Summary  After the procedure, it is common to have mild discomfort in the abdomen and a sore throat.  Check your incision areas every day for signs of infection.  Return to your normal activities as told by your health care provider. Ask your health care provider what activities are safe for you. This information is not intended to replace advice given to you by your health care provider. Make sure you discuss any questions you have with your health care provider. Document Released: 07/10/2015 Document Revised: 07/11/2017 Document Reviewed: 01/22/2017 Elsevier Patient Education  [employer].  Bilateral Salpingo-Oophorectomy, Care After This sheet gives you information about how to care for yourself after your procedure. Your health care provider may also give you more specific instructions. If you have problems or questions, contact your health care provider. What can I expect after the procedure? After the procedure,  it is common to have:  Abdominal pain.  Some occasional vaginal bleeding (spotting).  Tiredness.  Symptoms of menopause, such as hot flashes, night sweats, or mood swings. Follow these instructions at home: Incision care   Keep your incision area and your bandage (dressing) clean and dry.  Follow instructions from your health care provider about how to take care of your incision. Make sure you: ? Wash your hands with soap and water before you change your dressing. If soap and water are not available, use hand sanitizer. ? Change your dressing as told by your health care provider. ? Leave stitches (sutures), staples, skin glue, or adhesive strips in place. These skin closures may need to stay in place for 2 weeks or longer. If adhesive strip edges start to loosen and curl up, you may trim the loose edges. Do not remove  adhesive strips completely unless your health care provider tells you to do that.  Check your incision area every day for signs of infection. Check for: ? Redness, swelling, or pain. ? Fluid or blood. ? Warmth. ? Pus or a bad smell. Activity   Do not drive or use heavy machinery while taking prescription pain medicine.  Do not drive for 24 hours if you received a medicine to help you relax (sedative) during your procedure.  Take frequent, short walks throughout the day. Rest when you get tired. Ask your health care provider what activities are safe for you.  Avoid activity that requires great effort. Also, avoid heavy lifting. Do not lift anything that is heavier than 10 lbs. (4.5 kg), or the limit that your health care provider tells you, until he or she says that it is safe to do so.  Do not douche, use tampons, or have sex until your health care provider approves. General instructions   To prevent or treat constipation while you are taking prescription pain medicine, your health care provider may recommend that you: ? Drink enough fluid to keep your urine  clear or pale yellow. ? Take over-the-counter or prescription medicines. ? Eat foods that are high in fiber, such as fresh fruits and vegetables, whole grains, and beans. ? Limit foods that are high in fat and processed sugars, such as fried and sweet foods.  Take over-the-counter and prescription medicines only as told by your health care provider.  Do not take baths, swim, or use a hot tub until your health care provider approves. Ask your health care provider if you can take showers. You may only be allowed to take sponge baths for bathing.  Wear compression stockings as told by your health care provider. These stockings help to prevent blood clots and reduce swelling in your legs.  Keep all follow-up visits as told by your health care provider. This is important. Contact a health care provider if:  You have pain when you urinate.  You have pus or a bad smelling discharge coming from your vagina.  You have redness, swelling, or pain around your incision.  You have fluid or blood coming from your incision.  Your incision feels warm to the touch.  You have pus or a bad smell coming from your incision.  You have a fever.  Your incision starts to break open.  You have pain in the abdomen, and it gets worse or does not get better when you take medicine.  You develop a rash.  You develop nausea and vomiting.  You feel lightheaded. Get help right away if:  You develop pain in your chest or leg.  You become short of breath.  You faint.  You have increased bleeding from your vagina. Summary  After the procedure, it is common to have pain, bleeding in the vagina, and symptoms of menopause.  Follow instructions from your health care provider about how to take care of your incision.  Follow instructions from your health care provider about activities and restrictions.  Check your incision every day for signs of infection and report any symptoms to your health care  provider. This information is not intended to replace advice given to you by your health care provider. Make sure you discuss any questions you have with your health care provider. Document Released: 07/29/2005 Document Revised: 10/02/2018 Document Reviewed: 09/02/2016 Elsevier Patient Education  2020 [place] Anesthesia, Adult, Care After This sheet gives you information about how  to care for yourself after your procedure. Your health care provider may also give you more specific instructions. If you have problems or questions, contact your health care provider. What can I expect after the procedure? After the procedure, the following side effects are common:  Pain or discomfort at the IV site.  Nausea.  Vomiting.  Sore throat.  Trouble concentrating.  Feeling cold or chills.  Weak or tired.  Sleepiness and fatigue.  Soreness and body aches. These side effects can affect parts of the body that were not involved in surgery. Follow these instructions at home:  For at least 24 hours after the procedure:  Have a responsible adult stay with you. It is important to have someone help care for you until you are awake and alert.  Rest as needed.  Do not: ? Participate in activities in which you could fall or become injured. ? Drive. ? Use heavy machinery. ? Drink alcohol. ? Take sleeping pills or medicines that cause drowsiness. ? Make important decisions or sign legal documents. ? Take care of children on your own. Eating and drinking  Follow any instructions from your health care provider about eating or drinking restrictions.  When you feel hungry, start by eating small amounts of foods that are soft and easy to digest (bland), such as toast. Gradually return to your regular diet.  Drink enough fluid to keep your urine pale yellow.  If you vomit, rehydrate by drinking water, juice, or clear broth. General instructions  If you have sleep apnea, surgery  and certain medicines can increase your risk for breathing problems. Follow instructions from your health care provider about wearing your sleep device: ? Anytime you are sleeping, including during daytime naps. ? While taking prescription pain medicines, sleeping medicines, or medicines that make you drowsy.  Return to your normal activities as told by your health care provider. Ask your health care provider what activities are safe for you.  Take over-the-counter and prescription medicines only as told by your health care provider.  If you smoke, do not smoke without supervision.  Keep all follow-up visits as told by your health care provider. This is important. Contact a health care provider if:  You have nausea or vomiting that does not get better with medicine.  You cannot eat or drink without vomiting.  You have pain that does not get better with medicine.  You are unable to pass urine.  You develop a skin rash.  You have a fever.  You have redness around your IV site that gets worse. Get help right away if:  You have difficulty breathing.  You have chest pain.  You have blood in your urine or stool, or you vomit blood. Summary  After the procedure, it is common to have a sore throat or nausea. It is also common to feel tired.  Have a responsible adult stay with you for the first 24 hours after general anesthesia. It is important to have someone help care for you until you are awake and alert.  When you feel hungry, start by eating small amounts of foods that are soft and easy to digest (bland), such as toast. Gradually return to your regular diet.  Drink enough fluid to keep your urine pale yellow.  Return to your normal activities as told by your health care provider. Ask your health care provider what activities are safe for you. This information is not intended to replace advice given to you by your health care provider.  Make sure you discuss any questions you  have with your health care provider. Document Released: 11/04/2000 Document Revised: 08/01/2017 Document Reviewed: 03/14/2017 Elsevier Patient Education  2020 [employer]. How to Use Chlorhexidine for Bathing Chlorhexidine gluconate (CHG) is a germ-killing (antiseptic) solution that is used to clean the skin. It can get rid of the bacteria that normally live on the skin and can keep them away for about 24 hours. To clean your skin with CHG, you may be given:  A CHG solution to use in the shower or as part of a sponge bath.  A prepackaged cloth that contains CHG. Cleaning your skin with CHG may help lower the risk for infection:  While you are staying in the intensive care unit of the hospital.  If you have a vascular access, such as a central line, to provide short-term or long-term access to your veins.  If you have a catheter to drain urine from your bladder.  If you are on a ventilator. A ventilator is a machine that helps you breathe by moving air in and out of your lungs.  After surgery. What are the risks? Risks of using CHG include:  A skin reaction.  Hearing loss, if CHG gets in your ears.  Eye injury, if CHG gets in your eyes and is not rinsed out.  The CHG product catching fire. Make sure that you avoid smoking and flames after applying CHG to your skin. Do not use CHG:  If you have a chlorhexidine allergy or have previously reacted to chlorhexidine.  On babies younger than 18 months of age. How to use CHG solution  Use CHG only as told by your health care provider, and follow the instructions on the label.  Use the full amount of CHG as directed. Usually, this is one bottle. During a shower Follow these steps when using CHG solution during a shower (unless your health care provider gives you different instructions): 1. Start the shower. 2. Use your normal soap and shampoo to wash your face and hair. 3. Turn off the shower or move out of the shower stream. 4.  Pour the CHG onto a clean washcloth. Do not use any type of brush or rough-edged sponge. 5. Starting at your neck, lather your body down to your toes. Make sure you follow these instructions: ? If you will be having surgery, pay special attention to the part of your body where you will be having surgery. Scrub this area for at least 1 minute. ? Do not use CHG on your head or face. If the solution gets into your ears or eyes, rinse them well with water. ? Avoid your genital area. ? Avoid any areas of skin that have broken skin, cuts, or scrapes. ? Scrub your back and under your arms. Make sure to wash skin folds. 6. Let the lather sit on your skin for 1-2 minutes or as long as told by your health care provider. 7. Thoroughly rinse your entire body in the shower. Make sure that all body creases and crevices are rinsed well. 8. Dry off with a clean towel. Do not put any substances on your body afterward-such as powder, lotion, or perfume-unless you are told to do so by your health care provider. Only use lotions that are recommended by the manufacturer. 9. Put on clean clothes or pajamas. 10. If it is the night before your surgery, sleep in clean sheets.  During a sponge bath Follow these steps when using CHG solution during  a sponge bath (unless your health care provider gives you different instructions): 1. Use your normal soap and shampoo to wash your face and hair. 2. Pour the CHG onto a clean washcloth. 3. Starting at your neck, lather your body down to your toes. Make sure you follow these instructions: ? If you will be having surgery, pay special attention to the part of your body where you will be having surgery. Scrub this area for at least 1 minute. ? Do not use CHG on your head or face. If the solution gets into your ears or eyes, rinse them well with water. ? Avoid your genital area. ? Avoid any areas of skin that have broken skin, cuts, or scrapes. ? Scrub your back and under your  arms. Make sure to wash skin folds. 4. Let the lather sit on your skin for 1-2 minutes or as long as told by your health care provider. 5. Using a different clean, wet washcloth, thoroughly rinse your entire body. Make sure that all body creases and crevices are rinsed well. 6. Dry off with a clean towel. Do not put any substances on your body afterward-such as powder, lotion, or perfume-unless you are told to do so by your health care provider. Only use lotions that are recommended by the manufacturer. 7. Put on clean clothes or pajamas. 8. If it is the night before your surgery, sleep in clean sheets. How to use CHG prepackaged cloths  Only use CHG cloths as told by your health care provider, and follow the instructions on the label.  Use the CHG cloth on clean, dry skin.  Do not use the CHG cloth on your head or face unless your health care provider tells you to.  When washing with the CHG cloth: ? Avoid your genital area. ? Avoid any areas of skin that have broken skin, cuts, or scrapes. Before surgery Follow these steps when using a CHG cloth to clean before surgery (unless your health care provider gives you different instructions): 1. Using the CHG cloth, vigorously scrub the part of your body where you will be having surgery. Scrub using a back-and-forth motion for 3 minutes. The area on your body should be completely wet with CHG when you are done scrubbing. 2. Do not rinse. Discard the cloth and let the area air-dry. Do not put any substances on the area afterward, such as powder, lotion, or perfume. 3. Put on clean clothes or pajamas. 4. If it is the night before your surgery, sleep in clean sheets.  For general bathing Follow these steps when using CHG cloths for general bathing (unless your health care provider gives you different instructions). 1. Use a separate CHG cloth for each area of your body. Make sure you wash between any folds of skin and between your fingers and  toes. Wash your body in the following order, switching to a new cloth after each step: ? The front of your neck, shoulders, and chest. ? Both of your arms, under your arms, and your hands. ? Your stomach and groin area, avoiding the genitals. ? Your right leg and foot. ? Your left leg and foot. ? The back of your neck, your back, and your buttocks. 2. Do not rinse. Discard the cloth and let the area air-dry. Do not put any substances on your body afterward-such as powder, lotion, or perfume-unless you are told to do so by your health care provider. Only use lotions that are recommended by the manufacturer. 3. Put  on clean clothes or pajamas. Contact a health care provider if:  Your skin gets irritated after scrubbing.  You have questions about using your solution or cloth. Get help right away if:  Your eyes become very red or swollen.  Your eyes itch badly.  Your skin itches badly and is red or swollen.  Your hearing changes.  You have trouble seeing.  You have swelling or tingling in your mouth or throat.  You have trouble breathing.  You swallow any chlorhexidine. Summary  Chlorhexidine gluconate (CHG) is a germ-killing (antiseptic) solution that is used to clean the skin. Cleaning your skin with CHG may help to lower your risk for infection.  You may be given CHG to use for bathing. It may be in a bottle or in a prepackaged cloth to use on your skin. Carefully follow your health care provider's instructions and the instructions on the product label.  Do not use CHG if you have a chlorhexidine allergy.  Contact your health care provider if your skin gets irritated after scrubbing. This information is not intended to replace advice given to you by your health care provider. Make sure you discuss any questions you have with your health care provider. Document Released: 04/22/2012 Document Revised: 10/15/2018 Document Reviewed: 06/26/2017 Elsevier Patient Education  2020  [employer].

## 2019-04-08 ENCOUNTER — Other Ambulatory Visit: Payer: Self-pay | Admitting: Obstetrics and Gynecology

## 2019-04-09 ENCOUNTER — Encounter (HOSPITAL_COMMUNITY): Payer: Self-pay

## 2019-04-09 ENCOUNTER — Other Ambulatory Visit (HOSPITAL_COMMUNITY)
Admission: RE | Admit: 2019-04-09 | Discharge: 2019-04-09 | Disposition: A | Discharge status: Home / Self Care | Payer: BC Managed Care – PPO | Source: Ambulatory Visit | Attending: Obstetrics and Gynecology | Admitting: Obstetrics and Gynecology

## 2019-04-09 ENCOUNTER — Encounter (HOSPITAL_COMMUNITY)
Admission: RE | Admit: 2019-04-09 | Discharge: 2019-04-09 | Disposition: A | Discharge status: Home / Self Care | Payer: BC Managed Care – PPO | Source: Ambulatory Visit | Attending: Obstetrics and Gynecology | Admitting: Obstetrics and Gynecology

## 2019-04-09 ENCOUNTER — Other Ambulatory Visit: Payer: Self-pay

## 2019-04-09 DIAGNOSIS — Z20828 Contact with and (suspected) exposure to other viral communicable diseases: Secondary | ICD-10-CM | POA: Insufficient documentation

## 2019-04-09 DIAGNOSIS — Z01812 Encounter for preprocedural laboratory examination: Secondary | ICD-10-CM | POA: Insufficient documentation

## 2019-04-09 LAB — CBC
HCT: 41.8 % (ref 36.0–46.0)
Hemoglobin: 13.4 g/dL (ref 12.0–15.0)
MCH: 27.9 pg (ref 26.0–34.0)
MCHC: 32.1 g/dL (ref 30.0–36.0)
MCV: 87.1 fL (ref 80.0–100.0)
Platelets: 186 10*3/uL (ref 150–400)
RBC: 4.8 MIL/uL (ref 3.87–5.11)
RDW: 13 % (ref 11.5–15.5)
WBC: 6.7 10*3/uL (ref 4.0–10.5)
nRBC: 0 % (ref 0.0–0.2)

## 2019-04-09 LAB — COMPREHENSIVE METABOLIC PANEL
ALT: 11 U/L (ref 0–44)
AST: 19 U/L (ref 15–41)
Albumin: 4.3 g/dL (ref 3.5–5.0)
Alkaline Phosphatase: 70 U/L (ref 38–126)
Anion gap: 8 (ref 5–15)
BUN: 19 mg/dL (ref 6–20)
CO2: 28 mmol/L (ref 22–32)
Calcium: 9.4 mg/dL (ref 8.9–10.3)
Chloride: 103 mmol/L (ref 98–111)
Creatinine, Ser: 0.78 mg/dL (ref 0.44–1.00)
GFR calc Af Amer: >60 mL/min (ref >60)
GFR calc non Af Amer: >60 mL/min (ref >60)
Glucose, Bld: 98 mg/dL (ref 70–99)
Potassium: 4 mmol/L (ref 3.5–5.1)
Sodium: 139 mmol/L (ref 135–145)
Total Bilirubin: 0.7 mg/dL (ref 0.3–1.2)
Total Protein: 6.8 g/dL (ref 6.5–8.1)

## 2019-04-09 LAB — TYPE AND SCREEN
ABO/RH(D): O POS
Antibody Screen: NEGATIVE

## 2019-04-09 LAB — SARS CORONAVIRUS 2 (TAT 6-24 HRS): SARS Coronavirus 2: NEGATIVE

## 2019-04-12 ENCOUNTER — Telehealth: Payer: Self-pay | Admitting: Obstetrics and Gynecology

## 2019-04-12 NOTE — H&P (Addendum)
Crystal Duffy is an 63 y.o. female. She is admitted thru day surgery for a Laparoscopic Bilateral salpingo oophorectomy for an enlarged left ovary, with a normal Ca-125. The removal of the remaining (right) tube and ovary is to reduce risk of future ovarian neoplasm.  The patient has had a prior endometrial ablation, and the recent u/s shows the endometrium is thinned out at 3 mm maximal thickness, very reassuring.  She is having no bleeding. We have discussed the option of removal of the uterus as part of the procedure, and she is not interested or inclined to pursue this. She is on no hormone therapy, and having no vasomotor symptoms. The patient has a mild rectocele, but is managing this with diet and occasional stool softeners, there is no plan to address this at this time.  Pertinent Gynecological History: Menses: post-menopausal Bleeding: none Contraception: post menopausal status DES exposure: unknown Blood transfusions: none Sexually transmitted diseases: no past history Previous GYN Procedures: endometrial ablation  Ovarian cystectomy left, 2005 Last mammogram: normal Date: 03/19/2019 Last pap: normal Date: 03/05/2019 OB History: G, P   Menstrual History: Menarche age:  No LMP recorded. Patient has had an ablation.    Past Medical History:  Diagnosis Date  . Anemia     Past Surgical History:  Procedure Laterality Date  . DIAGNOSTIC LAPAROSCOPY  ovarian cyst  . ENDOMETRIAL ABLATION    . OVARIAN CYST REMOVAL Left    2005  . TUBAL LIGATION      Family History  Problem Relation Age of Onset  . Diabetes Father   . Diabetes Brother   . Diabetes Brother     Social History:  reports that she has never smoked. She has never used smokeless tobacco. She reports current alcohol use of about 4.0 standard drinks of alcohol per week. She reports that she does not use drugs.  Allergies: No Known Allergies  No medications prior to admission.    ROS  There were no vitals  taken for this visit. Physical Exam  Constitutional: She is oriented to person, place, and time. She appears well-developed and well-nourished. No distress.  HENT:  Head: Normocephalic and atraumatic.  Eyes: Pupils are equal, round, and reactive to light.  Neck: Normal range of motion. Neck supple. No thyromegaly present.  Cardiovascular: Normal rate and regular rhythm.  Respiratory: Effort normal.  GI: Soft. Bowel sounds are normal. She exhibits no distension and no mass. There is no abdominal tenderness. There is no rebound and no guarding.  Genitourinary:    Vagina normal.     Genitourinary Comments: Mild rectocele, assymptomatic   Musculoskeletal: Normal range of motion.  Neurological: She is alert and oriented to person, place, and time. She has normal reflexes.  Skin: Skin is warm and dry.  Psychiatric: She has a normal mood and affect. Her behavior is normal. Judgment and thought content normal.    No results found for this or any previous visit (from the past 24 hour(s)). CLINICAL DATA:  LEFT ovarian mass, postmenopausal  EXAM: ULTRASOUND PELVIS TRANSVAGINAL  TECHNIQUE: Transvaginal ultrasound examination of the pelvis was performed including evaluation of the uterus, ovaries, adnexal regions, and pelvic cul-de-sac.  COMPARISON:  10/05/2007  FINDINGS: Uterus  Measurements: 6.4 x 3.1 x 4.1 cm = volume: 42 mL. Normal morphology without mass  Endometrium  Thickness: 3 mm. Few tiny basal calcifications up to 3 mm. No endometrial fluid.  Right ovary  Not visualized on either transabdominal or endovaginal imaging, likely obscured by bowel  Left ovary  Measurements: 2.4 x 1.4 x 1.0 cm = volume: 1.7 mL. Probable small corpus luteum.  Other findings: Large LEFT adnexal cyst identified, immediately adjacent to LEFT ovary, 7.1 x 5.2 x 5.5 cm in size, containing scattered low level internal echogenicity. Several small areas of minor mural nodularity,  some of which are hyperechoic and nonshadowing, additional of which are nonspecific. Single tiny thin partial septation. No internal blood flow on color Doppler imaging.  IMPRESSION: Unremarkable uterus and endometrial complex.  Nonvisualization of RIGHT ovary.  Large LEFT adnexal cyst of either ovarian or paraovarian origin, 7.1 cm in size, complicated by diffuse low level internal echogenicity, minimal mural nodularity and a single tiny partial thin septation.  Based on size, appearance, and postmenopausal status, surgical evaluation is recommended.  This recommendation follows the consensus statement: Management of Asymptomatic Ovarian and Other Adnexal Cysts Imaged at [place]: Society of Radiologists in [place]. Radiology 2010; [phone].  These results will be called to the ordering clinician or representative by the Radiologist Assistant, and communication documented in the PACS or zVision Dashboard.   Electronically Signed   By: Lavonia Dana M.D.   On: 03/10/2019 13:44  CBC    Component Value Date/Time   WBC 6.7 04/09/2019 1444   RBC 4.80 04/09/2019 1444   HGB 13.4 04/09/2019 1444   HCT 41.8 04/09/2019 1444   PLT 186 04/09/2019 1444   MCV 87.1 04/09/2019 1444   MCH 27.9 04/09/2019 1444   MCHC 32.1 04/09/2019 1444   RDW 13.0 04/09/2019 1444   CMP     Component Value Date/Time   NA 139 04/09/2019 1444   K 4.0 04/09/2019 1444   CL 103 04/09/2019 1444   CO2 28 04/09/2019 1444   GLUCOSE 98 04/09/2019 1444   BUN 19 04/09/2019 1444   CREATININE 0.78 04/09/2019 1444   CALCIUM 9.4 04/09/2019 1444   PROT 6.8 04/09/2019 1444   ALBUMIN 4.3 04/09/2019 1444   AST 19 04/09/2019 1444   ALT 11 04/09/2019 1444   ALKPHOS 70 04/09/2019 1444   BILITOT 0.7 04/09/2019 1444   GFRNONAA >60 04/09/2019 1444   GFRAA >60 04/09/2019 1444     Assessment/Plan: assymptomatic left ovarian postmenopausal cyst  Plan: Laparoscopic  bilateral salpingoophorectomy. Alternatives including further observation, supracervical hysterectomy and potential complications such as bleeding, infection, injury to adjacent organs, reviewed in preop conversations with the patient, who is satisfied with current plans.  [name] 04/12/2019, 10:52 AM

## 2019-04-13 ENCOUNTER — Encounter (HOSPITAL_COMMUNITY)
Admission: RE | Disposition: A | Discharge status: Home / Self Care | Payer: Self-pay | Source: Home / Self Care | Attending: Obstetrics and Gynecology

## 2019-04-13 ENCOUNTER — Ambulatory Visit (HOSPITAL_COMMUNITY): Payer: BC Managed Care – PPO | Admitting: Anesthesiology

## 2019-04-13 ENCOUNTER — Encounter (HOSPITAL_COMMUNITY): Payer: Self-pay | Admitting: *Deleted

## 2019-04-13 ENCOUNTER — Other Ambulatory Visit: Payer: Self-pay

## 2019-04-13 ENCOUNTER — Observation Stay (HOSPITAL_COMMUNITY)
Admission: RE | Admit: 2019-04-13 | Discharge: 2019-04-13 | Disposition: A | Discharge status: Home / Self Care | Payer: BC Managed Care – PPO | Attending: Obstetrics and Gynecology | Admitting: Obstetrics and Gynecology

## 2019-04-13 DIAGNOSIS — N7011 Chronic salpingitis: Secondary | ICD-10-CM | POA: Diagnosis not present

## 2019-04-13 DIAGNOSIS — D271 Benign neoplasm of left ovary: Secondary | ICD-10-CM | POA: Diagnosis not present

## 2019-04-13 DIAGNOSIS — N838 Other noninflammatory disorders of ovary, fallopian tube and broad ligament: Secondary | ICD-10-CM | POA: Insufficient documentation

## 2019-04-13 DIAGNOSIS — N83202 Unspecified ovarian cyst, left side: Secondary | ICD-10-CM | POA: Diagnosis present

## 2019-04-13 DIAGNOSIS — N83201 Unspecified ovarian cyst, right side: Secondary | ICD-10-CM

## 2019-04-13 HISTORY — PX: LAPAROSCOPIC BILATERAL SALPINGO OOPHERECTOMY: SHX5890

## 2019-04-13 SURGERY — SALPINGO-OOPHORECTOMY, BILATERAL, LAPAROSCOPIC
Anesthesia: General | Laterality: Bilateral

## 2019-04-13 MED ORDER — PROMETHAZINE HCL 25 MG/ML IJ SOLN: 6.2500 mg | INTRAMUSCULAR | Status: DC | PRN

## 2019-04-13 MED ORDER — LIDOCAINE HCL (CARDIAC) PF 100 MG/5ML IV SOSY
PREFILLED_SYRINGE | INTRAVENOUS | Status: DC | PRN
  Administered 2019-04-13 (×2): 50 mg via INTRAVENOUS

## 2019-04-13 MED ORDER — MIDAZOLAM HCL 5 MG/5ML IJ SOLN
INTRAMUSCULAR | Status: DC | PRN
  Administered 2019-04-13: 1 mg via INTRAVENOUS

## 2019-04-13 MED ORDER — ROCURONIUM BROMIDE 100 MG/10ML IV SOLN
INTRAVENOUS | Status: DC | PRN
  Administered 2019-04-13: 50 mg via INTRAVENOUS

## 2019-04-13 MED ORDER — DEXAMETHASONE SODIUM PHOSPHATE 10 MG/ML IJ SOLN
INTRAMUSCULAR | Status: DC | PRN
  Administered 2019-04-13: 5 mg via INTRAVENOUS

## 2019-04-13 MED ORDER — ONDANSETRON HCL 4 MG/2ML IJ SOLN
INTRAMUSCULAR | Status: AC
  Filled 2019-04-13: qty 2

## 2019-04-13 MED ORDER — MIDAZOLAM HCL 2 MG/2ML IJ SOLN
INTRAMUSCULAR | Status: AC
  Filled 2019-04-13: qty 2

## 2019-04-13 MED ORDER — LIDOCAINE 2% (20 MG/ML) 5 ML SYRINGE
INTRAMUSCULAR | Status: AC
  Filled 2019-04-13: qty 5

## 2019-04-13 MED ORDER — HYDROCODONE-ACETAMINOPHEN 5-325 MG PO TABS: 1.0000 | ORAL_TABLET | Freq: Four times a day (QID) | ORAL | 0 refills | Status: DC | PRN

## 2019-04-13 MED ORDER — FENTANYL CITRATE (PF) 250 MCG/5ML IJ SOLN
INTRAMUSCULAR | Status: AC
  Filled 2019-04-13: qty 5

## 2019-04-13 MED ORDER — PROPOFOL 10 MG/ML IV BOLUS
INTRAVENOUS | Status: DC | PRN
  Administered 2019-04-13: 150 mg via INTRAVENOUS

## 2019-04-13 MED ORDER — PROPOFOL 10 MG/ML IV BOLUS
INTRAVENOUS | Status: AC
  Filled 2019-04-13: qty 20

## 2019-04-13 MED ORDER — KETOROLAC TROMETHAMINE 30 MG/ML IJ SOLN
INTRAMUSCULAR | Status: AC
  Filled 2019-04-13: qty 1

## 2019-04-13 MED ORDER — HYDROCODONE-ACETAMINOPHEN 7.5-325 MG PO TABS: 1.0000 | ORAL_TABLET | Freq: Once | ORAL | Status: DC | PRN

## 2019-04-13 MED ORDER — MIDAZOLAM HCL 2 MG/2ML IJ SOLN: 0.5000 mg | Freq: Once | INTRAMUSCULAR | Status: DC | PRN

## 2019-04-13 MED ORDER — BUPIVACAINE HCL (PF) 0.5 % IJ SOLN
INTRAMUSCULAR | Status: DC | PRN
  Administered 2019-04-13: 18 mL

## 2019-04-13 MED ORDER — CEFAZOLIN SODIUM-DEXTROSE 2-4 GM/100ML-% IV SOLN
2.0000 g | INTRAVENOUS | Status: AC
  Administered 2019-04-13: 2 g via INTRAVENOUS
  Filled 2019-04-13: qty 100

## 2019-04-13 MED ORDER — KETOROLAC TROMETHAMINE 10 MG PO TABS: 10.0000 mg | ORAL_TABLET | Freq: Four times a day (QID) | ORAL | 0 refills | Status: DC

## 2019-04-13 MED ORDER — ROCURONIUM BROMIDE 10 MG/ML (PF) SYRINGE
PREFILLED_SYRINGE | INTRAVENOUS | Status: AC
  Filled 2019-04-13: qty 10

## 2019-04-13 MED ORDER — BUPIVACAINE HCL (PF) 0.5 % IJ SOLN
INTRAMUSCULAR | Status: AC
  Filled 2019-04-13: qty 30

## 2019-04-13 MED ORDER — FENTANYL CITRATE (PF) 100 MCG/2ML IJ SOLN
INTRAMUSCULAR | Status: DC | PRN
  Administered 2019-04-13: 50 ug via INTRAVENOUS
  Administered 2019-04-13: 100 ug via INTRAVENOUS

## 2019-04-13 MED ORDER — HYDROMORPHONE HCL 1 MG/ML IJ SOLN: 0.2500 mg | INTRAMUSCULAR | Status: DC | PRN

## 2019-04-13 MED ORDER — LACTATED RINGERS IV SOLN
INTRAVENOUS | Status: DC
  Administered 2019-04-13: 1000 mL via INTRAVENOUS

## 2019-04-13 MED ORDER — DEXAMETHASONE SODIUM PHOSPHATE 10 MG/ML IJ SOLN
INTRAMUSCULAR | Status: AC
  Filled 2019-04-13: qty 1

## 2019-04-13 MED ORDER — ONDANSETRON HCL 4 MG/2ML IJ SOLN
INTRAMUSCULAR | Status: DC | PRN
  Administered 2019-04-13: 4 mg via INTRAVENOUS

## 2019-04-13 MED ORDER — SODIUM CHLORIDE 0.9 % IR SOLN
Status: DC | PRN
  Administered 2019-04-13: 1000 mL

## 2019-04-13 MED ORDER — SUGAMMADEX SODIUM 200 MG/2ML IV SOLN
INTRAVENOUS | Status: DC | PRN
  Administered 2019-04-13: 200 mg via INTRAVENOUS

## 2019-04-13 MED ORDER — KETOROLAC TROMETHAMINE 30 MG/ML IJ SOLN: INTRAMUSCULAR | Status: DC | PRN

## 2019-04-13 MED ORDER — EPHEDRINE SULFATE 50 MG/ML IJ SOLN
INTRAMUSCULAR | Status: DC | PRN
  Administered 2019-04-13: 10 mg via INTRAVENOUS

## 2019-04-13 MED ORDER — EPHEDRINE 5 MG/ML INJ
INTRAVENOUS | Status: AC
  Filled 2019-04-13: qty 10

## 2019-04-13 SURGICAL SUPPLY — 45 items
BAG RETRIEVAL 10MM (BASKET) ×1
BLADE SURG SZ11 CARB STEEL (BLADE) ×3 IMPLANT
BNDG ADH 1X3 FABRIC TAN LF (GAUZE/BANDAGES/DRESSINGS) ×9 IMPLANT
CLOTH BEACON ORANGE TIMEOUT ST (SAFETY) ×3 IMPLANT
COVER LIGHT HANDLE STERIS (MISCELLANEOUS) ×6 IMPLANT
DECANTER SPIKE VIAL GLASS SM (MISCELLANEOUS) ×3 IMPLANT
DURAPREP 26ML APPLICATOR (WOUND CARE) ×3 IMPLANT
ELECT REM PT RETURN 9FT ADLT (ELECTROSURGICAL) ×3
ELECTRODE REM PT RTRN 9FT ADLT (ELECTROSURGICAL) ×1 IMPLANT
FILTER SMOKE EVAC LAPAROSHD (FILTER) ×3 IMPLANT
GAUZE SPONGE 4X4 12PLY STRL (GAUZE/BANDAGES/DRESSINGS) ×3 IMPLANT
GLOVE BIOGEL PI IND STRL 7.0 (GLOVE) ×5 IMPLANT
GLOVE BIOGEL PI IND STRL 9 (GLOVE) ×1 IMPLANT
GLOVE BIOGEL PI INDICATOR 7.0 (GLOVE) ×10
GLOVE BIOGEL PI INDICATOR 9 (GLOVE) ×2
GLOVE ECLIPSE 6.5 STRL STRAW (GLOVE) ×6 IMPLANT
GLOVE ECLIPSE 9.0 STRL (GLOVE) ×6 IMPLANT
GOWN SPEC L3 XXLG W/TWL (GOWN DISPOSABLE) ×3 IMPLANT
GOWN STRL REUS W/TWL LRG LVL3 (GOWN DISPOSABLE) ×3 IMPLANT
INST SET LAPROSCOPIC GYN AP (KITS) ×3 IMPLANT
KIT TURNOVER KIT A (KITS) ×3 IMPLANT
MANIFOLD NEPTUNE II (INSTRUMENTS) ×3 IMPLANT
NEEDLE HYPO 25X1 1.5 SAFETY (NEEDLE) ×3 IMPLANT
NEEDLE INSUFFLATION 14GA 120MM (NEEDLE) ×3 IMPLANT
NS IRRIG 1000ML POUR BTL (IV SOLUTION) ×3 IMPLANT
PACK PERI GYN (CUSTOM PROCEDURE TRAY) ×3 IMPLANT
PAD ARMBOARD 7.5X6 YLW CONV (MISCELLANEOUS) ×3 IMPLANT
SET BASIN LINEN APH (SET/KITS/TRAYS/PACK) ×3 IMPLANT
SHEARS HARMONIC ACE PLUS 36CM (ENDOMECHANICALS) ×3 IMPLANT
SOLUTION ANTI FOG 6CC (MISCELLANEOUS) ×3 IMPLANT
SUT VIC AB 4-0 PS2 27 (SUTURE) ×3 IMPLANT
SUT VICRYL 0 UR6 27IN ABS (SUTURE) ×6 IMPLANT
SYR 10ML LL (SYRINGE) ×6 IMPLANT
SYR BULB IRRIGATION 50ML (SYRINGE) ×3 IMPLANT
SYS BAG RETRIEVAL 10MM (BASKET) ×2
SYSTEM BAG RETRIEVAL 10MM (BASKET) ×1 IMPLANT
TAPE CLOTH SURG 4X10 WHT LF (GAUZE/BANDAGES/DRESSINGS) ×3 IMPLANT
TRAP SPECIMEN MUCOUS 40CC (MISCELLANEOUS) ×3 IMPLANT
TRAY FOLEY W/BAG SLVR 16FR (SET/KITS/TRAYS/PACK) ×2
TRAY FOLEY W/BAG SLVR 16FR ST (SET/KITS/TRAYS/PACK) ×1 IMPLANT
TROCAR ENDO BLADELESS 11MM (ENDOMECHANICALS) ×3 IMPLANT
TROCAR ENDO BLADELESS 12MM (ENDOMECHANICALS) ×6 IMPLANT
TROCAR XCEL NON-BLD 5MMX100MML (ENDOMECHANICALS) ×3 IMPLANT
TUBING INSUFFLATION (TUBING) ×3 IMPLANT
WARMER LAPAROSCOPE (MISCELLANEOUS) ×3 IMPLANT

## 2019-04-13 NOTE — Interval H&P Note (Signed)
History and Physical Interval Note:  04/13/2019 7:17 AM  Crystal Duffy  has presented today for surgery, with the diagnosis of Ovarian Mass Rectocele.  The various methods of treatment have been discussed with the patient and family. After consideration of risks, benefits and other options for treatment, the patient has consented to  Procedure(s): LAPAROSCOPIC BILATERAL SALPINGO OOPHORECTOMY (Bilateral) as a surgical intervention.  The patient's history has been reviewed, patient examined, no change in status, stable for surgery.  I have reviewed the patient's chart and labs.  Questions were answered to the patient's satisfaction.     [name]

## 2019-04-13 NOTE — Transfer of Care (Signed)
Immediate Anesthesia Transfer of Care Note  Patient: Crystal Duffy  Procedure(s) Performed: LAPAROSCOPIC BILATERAL SALPINGO OOPHORECTOMY (Bilateral )  Patient Location: PACU  Anesthesia Type:General  Level of Consciousness: awake, alert , oriented and patient cooperative  Airway & Oxygen Therapy: Patient Spontanous Breathing  Post-op Assessment: Report given to RN, Post -op Vital signs reviewed and stable and Patient moving all extremities X 4  Post vital signs: Reviewed and stable  Last Vitals:  Vitals Value Taken Time  BP    Temp    Pulse 59 04/13/19 0843  Resp 9 04/13/19 0843  SpO2 96 % 04/13/19 0843  Vitals shown include unvalidated device data.  Last Pain:  Vitals:   04/13/19 0656  TempSrc: Oral  PainSc: 0-No pain      Patients Stated Pain Goal: 4 ([id number])  Complications: No apparent anesthesia complications

## 2019-04-13 NOTE — Discharge Instructions (Signed)
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 It might be done for a variety of reasons, such as to look for scar tissue, cancer, or a reason for abdomen (abdominal) pain. During the procedure, a thin, flexible tube that has a light and a camera on the end (laparoscope) is inserted through an incision in the abdomen. The image from the camera is shown on a monitor to help your surgeon see inside your body. Tell a health care provider about:  Any allergies you have.  All medicines you are taking, including vitamins, herbs, eye drops, creams, and over-the-counter medicines.  Any problems you or family members have had with anesthetic medicines.  Any blood disorders you have.  Any surgeries you have had.  Any medical conditions you have. What are the risks? Generally, this is a safe procedure. However, problems may occur, including:  Infection.  Bleeding.  Allergic reactions to medicines or dyes.  Damage to abdominal structures or organs, such as the intestines, liver, stomach, or spleen. What happens before the procedure? Medicines  Ask your health care provider about: ? Changing or stopping your regular medicines. This is especially important if you are taking diabetes medicines or blood thinners. ? Taking medicines such as aspirin and ibuprofen. These medicines can thin your blood. Do not take these medicines unless your health care provider tells you to take them. ? Taking over-the-counter medicines, vitamins, herbs, and supplements.  You may be given antibiotic medicine to help prevent infection. Staying hydrated Follow instructions from your health care provider about hydration, which may include:  Up to 2 hours before the procedure - you may continue to drink clear liquids, such as water, clear fruit juice, black coffee, and plain  tea. Eating and drinking restrictions Follow instructions from your health care provider about eating and drinking, which may include:  8 hours before the procedure - stop eating heavy meals or foods such as meat, fried foods, or fatty foods.  6 hours before the procedure - stop eating light meals or foods, such as toast or cereal.  6 hours before the procedure - stop drinking milk or drinks that contain milk.  2 hours before the procedure - stop drinking clear liquids. General instructions  Ask your health care provider how your surgical site will be marked or identified.  You may be asked to shower with a germ-killing soap.  Plan to have someone take you home from the hospital or clinic.  Plan to have a responsible adult care for you for at least 24 hours after you leave the hospital or clinic. This is important. What happens during the procedure?   To lower your risk of infection: ? Your health care team will wash or sanitize their hands. ? Hair may be removed from the surgical area. ? Your skin will be washed with soap.  An IV will be inserted into one of your veins.  You will be given a medicine to make you fall asleep (general anesthetic). You may also be given a medicine to help you relax (sedative).  A breathing tube will be placed down your throat to help you breathe during the procedure.  Your abdomen will be filled with an air-like gas so it expands. This will give the surgeon more room to operate and will make your organs easier to see.  Many small incisions will be made  in your abdomen.  A laparoscope and other surgical instruments will be inserted into your abdomen through the incisions.  A tissue sample may be removed from an organ for examination (biopsy). This will depend on the reason why you are having this procedure.  The laparoscope and other instruments will be removed from your abdomen.  The gas will be released.  Your incisions will be closed with  stitches (sutures) and covered with a bandage (dressing).  Your breathing tube will be removed. The procedure may vary among health care providers and hospitals. What happens after the procedure?   Your blood pressure, heart rate, breathing rate, and blood oxygen level will be monitored until the medicines you were given have worn off.  Do not drive for 24 hours if you were given a sedative during your procedure.  It is up to you to get the results of your procedure. Ask your health care provider, or the department that is doing the procedure, when your results will be ready. Summary  Diagnostic laparoscopy is a way to look for problems in the abdomen using small incisions.  Follow instructions from your health care provider about how to prepare for the procedure.  Plan to have a responsible adult care for you for at least 24 hours after you leave the hospital or clinic. This is important. This information is not intended to replace advice given to you by your health care provider. Make sure you discuss any questions you have with your health care provider. Document Released: 11/04/2000 Document Revised: 07/11/2017 Document Reviewed: 01/22/2017 Elsevier Patient Education  2020 [employer].

## 2019-04-13 NOTE — Op Note (Signed)
Please see the brief operative note for surgical details 

## 2019-04-13 NOTE — Anesthesia Postprocedure Evaluation (Signed)
Anesthesia Post Note  Patient: Crystal Duffy  Procedure(s) Performed: LAPAROSCOPIC BILATERAL SALPINGO OOPHORECTOMY (Bilateral )  Patient location during evaluation: PACU Anesthesia Type: General Level of consciousness: awake and alert, patient cooperative and oriented Pain management: pain level controlled Vital Signs Assessment: post-procedure vital signs reviewed and stable Respiratory status: nonlabored ventilation, spontaneous breathing and respiratory function stable Cardiovascular status: stable Postop Assessment: no apparent nausea or vomiting Anesthetic complications: no     Last Vitals:  Vitals:   04/13/19 0930 04/13/19 0944  BP:  107/84  Pulse: (!) 59 (!) 53  Resp: 12 16  Temp:  36.4 C  SpO2: 99% 100%    Last Pain:  Vitals:   04/13/19 0944  TempSrc: Oral  PainSc: 3                  Armistead Sult

## 2019-04-13 NOTE — Discharge Summary (Signed)
Admitting diagnosis left ovarian cyst Discharge diagnosis left ovarian cyst Procedure laparoscopic bilateral salpingo-oophorectomy Patient was admitted through day surgery underwent laparoscopic bilateral salpingo-oophorectomy with good surgical results and patient was allowed to go home today on Toradol and Vicodin with follow-up in 2 weeks our office along with routine post laparoscopy instructions

## 2019-04-13 NOTE — Anesthesia Preprocedure Evaluation (Signed)
Anesthesia Evaluation  Patient identified by MRN, date of birth, ID band Patient awake    Reviewed: Allergy & Precautions, NPO status , Patient's Chart, lab work & pertinent test results  Airway Mallampati: II  TM Distance: >3 FB Neck ROM: Full    Dental no notable dental hx. (+) Teeth Intact   Pulmonary neg pulmonary ROS,    Pulmonary exam normal breath sounds clear to auscultation       Cardiovascular Exercise Tolerance: Good negative cardio ROS Normal cardiovascular examI Rhythm:Regular Rate:Normal     Neuro/Psych negative neurological ROS  negative psych ROS   GI/Hepatic negative GI ROS, Neg liver ROS,   Endo/Other  negative endocrine ROS  Renal/GU negative Renal ROS  negative genitourinary   Musculoskeletal negative musculoskeletal ROS (+)   Abdominal   Peds negative pediatric ROS (+)  Hematology negative hematology ROS (+)   Anesthesia Other Findings   Reproductive/Obstetrics negative OB ROS                             Anesthesia Physical Anesthesia Plan  ASA: II  Anesthesia Plan: General   Post-op Pain Management:    Induction: Intravenous  PONV Risk Score and Plan:   Airway Management Planned: Oral ETT  Additional Equipment:   Intra-op Plan:   Post-operative Plan: Extubation in OR  Informed Consent: I have reviewed the patients History and Physical, chart, labs and discussed the procedure including the risks, benefits and alternatives for the proposed anesthesia with the patient or authorized representative who has indicated his/her understanding and acceptance.     Dental advisory given  Plan Discussed with: CRNA  Anesthesia Plan Comments: (Plan Full PPE use  Plan GETA d/w pt -WTP with the same )        Anesthesia Quick Evaluation

## 2019-04-13 NOTE — Op Note (Signed)
04/13/2019  8:45 AM  PATIENT:  Crystal Duffy  63 y.o. female  PRE-OPERATIVE DIAGNOSIS:  Postmenopausal Ovarian Cyst and low Ca125  POST-OPERATIVE DIAGNOSIS:  Postmenopausal Ovarian Cyst and low Ca125  PROCEDURE:  Procedure(s): LAPAROSCOPIC BILATERAL SALPINGO OOPHORECTOMY (Bilateral)  SURGEON:  Surgeon(s) and Role:    [name], MD - Primary  PHYSICIAN ASSISTANT:   ASSISTANTS: Zoila Shutter, CST  ANESTHESIA:   local and general  EBL:  5 mL   BLOOD ADMINISTERED:none  DRAINS: none   LOCAL MEDICATIONS USED:  MARCAINE    and Amount: 18 ml  SPECIMEN:  Source of Specimen:  Bilateral tubes and ovaries, and cytology of peritoneal fluid and Aspirate  DISPOSITION OF SPECIMEN:  PATHOLOGY  COUNTS:  YES  TOURNIQUET:  * No tourniquets in log *  DICTATION: .Dragon Dictation  PLAN OF CARE: Discharge to home after PACU  PATIENT DISPOSITION:  PACU - hemodynamically stable.   Delay start of Pharmacological VTE agent (>24hrs) due to surgical blood loss or risk of bleeding: not applicable Details of procedure: Patient was taken the operating room prepped and draped for combined abdominal and vaginal procedure, with Foley catheter placed, abdomen and perineum prepped and draped.  Timeout was conducted.  Ancef was administered..  Procedure was confirmed by surgical team. An infraumbilical vertical 1 cm skin incision was made as well as a transverse 3 cm skin incision at the level of the previous Pfannenstiel incision.  Palpation the abdomen showed the location of the sacral promontory so the abdomen was elevated pulled inferiorly and Veress needle used to introduce into the abdomen oriented toward the pelvis.  Water droplet technique showed easy intraperitoneal E flux of fluid, and CO2 was introduced under 4 mm intra-abdominal pressure to 3 L.  10 mm laparoscopic trocar was introduced carefully under direct visualization, and pelvis inspected.  There was no evidence of adhesions no  evidence of any nodularity or suspicion for tumor.  With the patient in Trendelenburg position we could see the left ovary which was a smooth cystic structure coming off of the ovary.  Suprapubic trocar was placed as well as a right lower quadrant trocar, 5 mm each attention was then directed to the pelvis with the retroperitoneum visualized and the ureters confirmed as being well out of the surgical area. Harmonic a 7 was then used to excise the left tube and ovary with good hemostasis.  This was left intact in the pelvis.  The right tube and ovary was then elevated, the ureter again confirmed as well out of the way, and harmonic a 7 used to amputate the right tube and ovary off of the attachments, the right IP ligament and broad ligament.  Hemostasis was satisfactory.  Pelvic washings were collected.  The Endo Catch bag was placed through the suprapubic site after it was converted to a number 12 mm sleeve.  The specimens were extracted, pulling the bag up to the abdominal wall.  With the bag out of the abdomen the specimen inside the bag was aspirated to decompress it so it can come out through the smaller incision. Photo was taken to confirm hemostasis in the pelvis. 120 cc of saline was instilled into the abdomen to assist with evacuation of carbon oxide.  The abdomen was deflated, hemostasis confirmed hemostasis at the puncture sites with the camera which was then extracted, then the fascial closure of the suprapubic and umbilical sites was performed using 0 Vicryl on the fascia. Subcutaneous tissues at the suprapubic site  were reapproximated with 4-0 Vicryl, then 4-0 Vicryl used to close the skin incisions followed by Steri-Strips.  Pressure dressing was placed on the umbilical site due to small bit of ooze from the subcutaneous tissues.  Sponge and needle counts were correct and surgical wand was used to confirm sponge count correct count.

## 2019-04-13 NOTE — Telephone Encounter (Signed)
Plans for tomorrow surgery reviewed with patient and we agreed on laparoscopic bilateral salpingo-oophorectomy

## 2019-04-13 NOTE — Anesthesia Procedure Notes (Signed)
Procedure Name: Intubation Date/Time: 04/13/2019 7:33 AM Performed by: Jonna Munro, CRNA Pre-anesthesia Checklist: Patient identified, Emergency Drugs available, Suction available, Patient being monitored and Timeout performed Patient Re-evaluated:Patient Re-evaluated prior to induction Oxygen Delivery Method: Circle system utilized Preoxygenation: Pre-oxygenation with 100% oxygen Induction Type: IV induction Ventilation: Mask ventilation without difficulty Laryngoscope Size: Mac and 3 Grade View: Grade I Tube type: Oral Tube size: 7.0 mm Number of attempts: 1 Airway Equipment and Method: Stylet Placement Confirmation: ETT inserted through vocal cords under direct vision,  positive ETCO2 and breath sounds checked- equal and bilateral Secured at: 22 cm Tube secured with: Tape Dental Injury: Teeth and Oropharynx as per pre-operative assessment

## 2019-04-14 ENCOUNTER — Encounter (HOSPITAL_COMMUNITY): Payer: Self-pay | Admitting: Obstetrics and Gynecology

## 2019-04-15 ENCOUNTER — Telehealth: Payer: Self-pay | Admitting: Obstetrics and Gynecology

## 2019-04-15 NOTE — Telephone Encounter (Signed)
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  Patient has postop appointment 21 April 2019

## 2019-04-21 ENCOUNTER — Encounter: Payer: Self-pay | Admitting: Obstetrics and Gynecology

## 2019-04-21 ENCOUNTER — Ambulatory Visit (INDEPENDENT_AMBULATORY_CARE_PROVIDER_SITE_OTHER): Payer: BC Managed Care – PPO | Admitting: Obstetrics and Gynecology

## 2019-04-21 ENCOUNTER — Other Ambulatory Visit: Payer: Self-pay

## 2019-04-21 DIAGNOSIS — D271 Benign neoplasm of left ovary: Secondary | ICD-10-CM | POA: Insufficient documentation

## 2019-04-21 DIAGNOSIS — Z9889 Other specified postprocedural states: Secondary | ICD-10-CM

## 2019-04-21 NOTE — Progress Notes (Signed)
Patient ID: Crystal Duffy, female   DOB: 1955-10-27, 63 y.o.   MRN: [id number]     Subjective:  Crystal Duffy is a 62 y.o. female now 1 weeks status post laparoscopic bilateral salpingo-oophorectomy.  Pathology report as recorded Notes recorded by [name], MD on 04/15/2019 at 6:01 PM EDT  Benign ovarian cyst on the right. On the Left, in the unremarkable left ovary was the incidental finding of thyroid tissues, benign. This is the first case ive seen of in my 37 years..    .1. Ovary and fallopian tube, right  - BENIGN OVARY  - FALLOPIAN TUBE WITH FIBROSIS AND CHRONIC INFLAMMATION  2. Ovary and fallopian tube, left  - OVARIAN CYST WITH STRUMA OVARII  - FALLOPIAN TUBE WITH FIBROSIS, CHRONIC INFLAMMATION AND PARATUBAL CYST (0.2 CM)  - NO MALIGNANCY IDENTIFIED  Microscopic Comment  2. Dr. Jeannie Done reviewed the case and agrees with the above diagnosis.  Crystal BUTLER MD  Pathologist, Electronic Had some tiredness after surgery but has now has more energy to move around. Mild amount of bruising is noted around the inferior largest incision.  No fever she is resuming her normal work activities at half days Review of Systems Negative except   Diet:   Normal   Bowel movements : normal.  Pain is controlled without any medications.  Objective:  There were no vitals taken for this visit. General:Well developed, well nourished.  No acute distress. Abdomen: Bowel sounds normal, soft, non-tender. Pelvic Exam: Deferred by patient  Incision(s):4 incision Healing well, no drainage, no erythema, no hernia, no swelling, no dehiscence  Assessment:  Post-Op 1 weeks s/p laparoscopic bilateral salpingo-oophorectomy for benign left ovarian cyst and  incidental Struma Ovarii.  Doing well postoperatively.   Plan:  1.Wound care discussed, acetone to remove surgical glue and ibuprofen, adequate rest. 2. current medications.NONE 4. return to work: not applicable. 5. Follow up in PRN or 3 yr  for pap.,  Annual mammograms advised  By signing my name below, I, Samul Dada, attest that this documentation has been prepared under the direction and in the presence of [name], MD. Electronically Signed: [place]. 04/21/19. 9:32 AM.  I personally performed the services described in this documentation, which was SCRIBED in my presence. The recorded information has been reviewed and considered accurate. It has been edited as necessary during review. [name], MD

## 2019-10-10 ENCOUNTER — Ambulatory Visit: Payer: BC Managed Care – PPO | Attending: Internal Medicine

## 2019-10-10 DIAGNOSIS — Z23 Encounter for immunization: Secondary | ICD-10-CM | POA: Insufficient documentation

## 2019-10-10 NOTE — Progress Notes (Signed)
   Covid-19 Vaccination Clinic  Name:  Crystal Duffy    MRN: [id number] DOB: [birth year]  10/10/2019  [name] was observed post Covid-19 immunization for 15 minutes without incidence. She was provided with Vaccine Information Sheet and instruction to access the V-Safe system.   [name] was instructed to call 911 with any severe reactions post vaccine: Marland Kitchen Difficulty breathing  . Swelling of your face and throat  . A fast heartbeat  . A bad rash all over your body  . Dizziness and weakness    Immunizations Administered    Name Date Dose VIS Date Route   Moderna COVID-19 Vaccine 10/10/2019  4:28 PM 0.5 mL 07/13/2019 Intramuscular   Manufacturer: Moderna   Lot: [id number]   [place]:[id number]

## 2019-11-13 ENCOUNTER — Ambulatory Visit: Payer: BC Managed Care – PPO | Attending: Internal Medicine

## 2019-11-13 DIAGNOSIS — Z23 Encounter for immunization: Secondary | ICD-10-CM

## 2019-11-13 NOTE — Progress Notes (Signed)
Pt did not stop by observation table after vaccination to check out.

## 2020-02-18 ENCOUNTER — Other Ambulatory Visit (HOSPITAL_COMMUNITY): Payer: Self-pay | Admitting: Internal Medicine

## 2020-02-18 DIAGNOSIS — Z1231 Encounter for screening mammogram for malignant neoplasm of breast: Secondary | ICD-10-CM

## 2020-03-20 ENCOUNTER — Other Ambulatory Visit: Payer: Self-pay

## 2020-03-20 ENCOUNTER — Ambulatory Visit (HOSPITAL_COMMUNITY)
Admission: RE | Admit: 2020-03-20 | Discharge: 2020-03-20 | Disposition: A | Discharge status: Home / Self Care | Payer: BC Managed Care – PPO | Source: Ambulatory Visit | Attending: Internal Medicine | Admitting: Internal Medicine

## 2020-03-20 DIAGNOSIS — Z1231 Encounter for screening mammogram for malignant neoplasm of breast: Secondary | ICD-10-CM | POA: Insufficient documentation

## 2020-04-23 IMAGING — MG DIGITAL SCREENING BILATERAL MAMMOGRAM WITH TOMO AND CAD
8 series · 9 of 24 positions shown · non-contrast
Comparison: Previous exam(s).

CLINICAL DATA: Screening.

EXAM:
DIGITAL SCREENING BILATERAL MAMMOGRAM WITH TOMO AND CAD

[L MLO synth-2D]
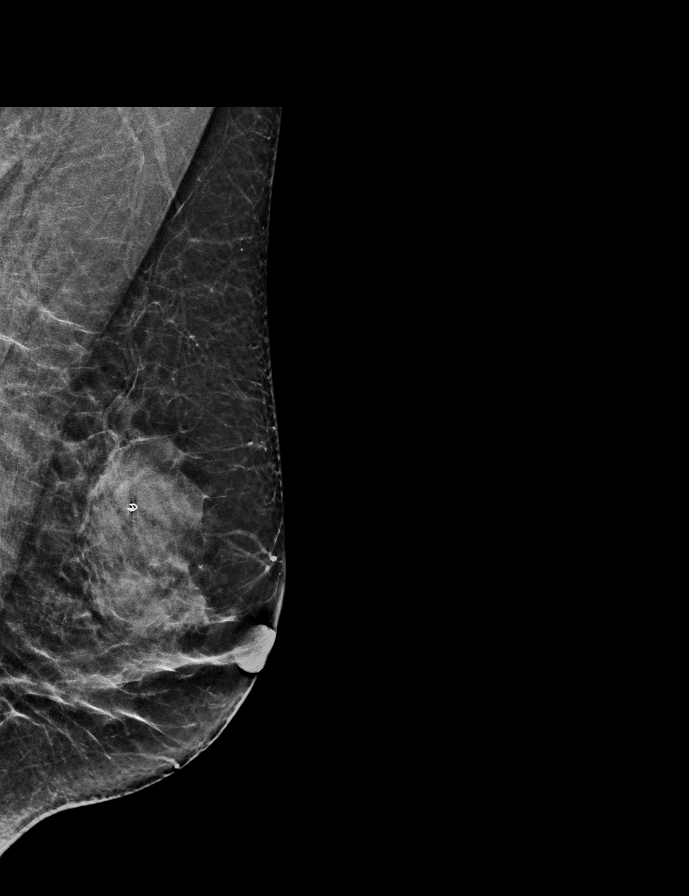

[R MLO synth-2D]
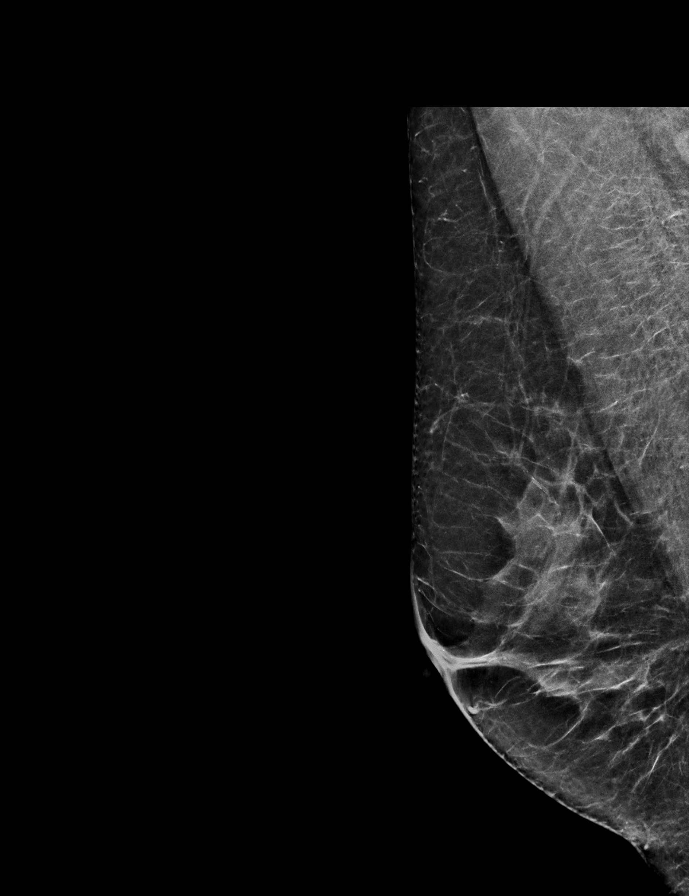

[R CC synth-2D]
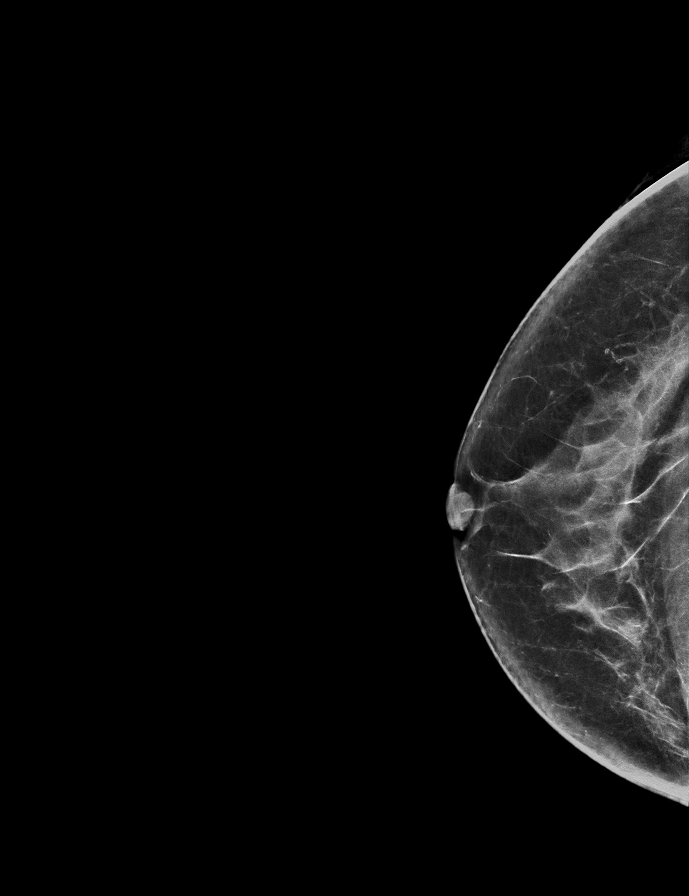

[L CC synth-2D]
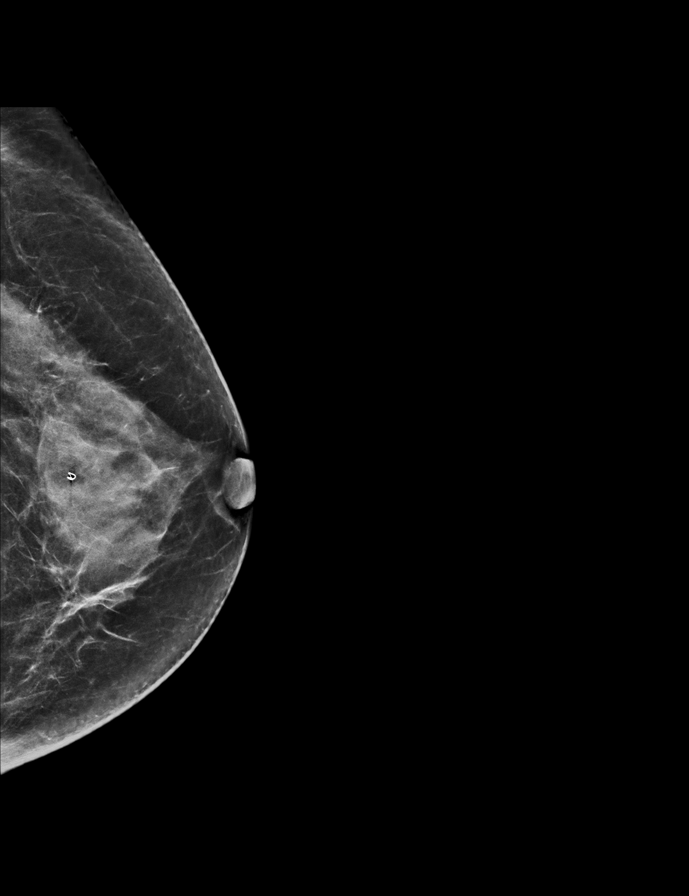

[L CC tomo · 2 of 61 frames shown]
[frame 20/61]
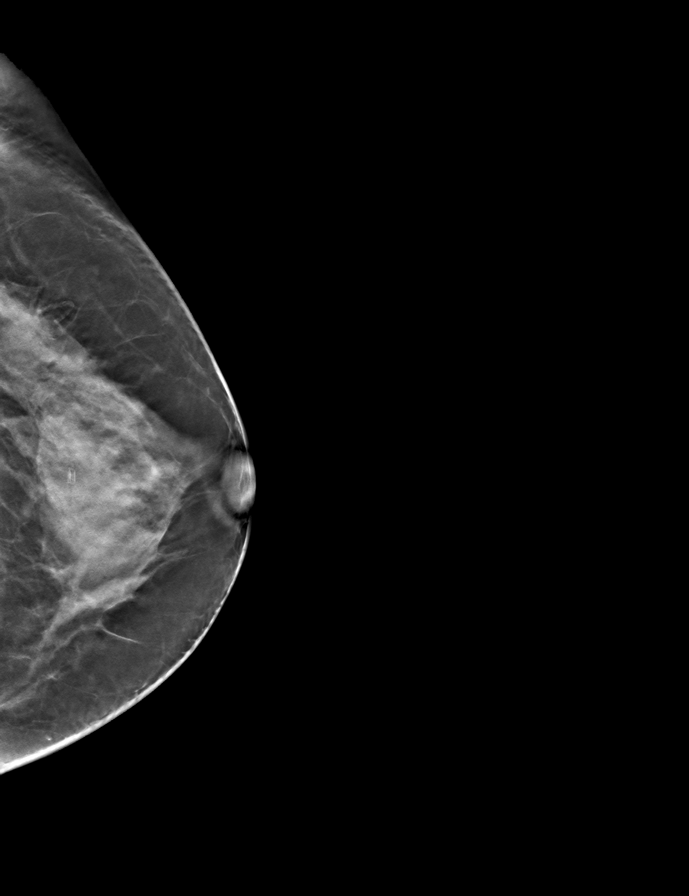
[frame 31/61]
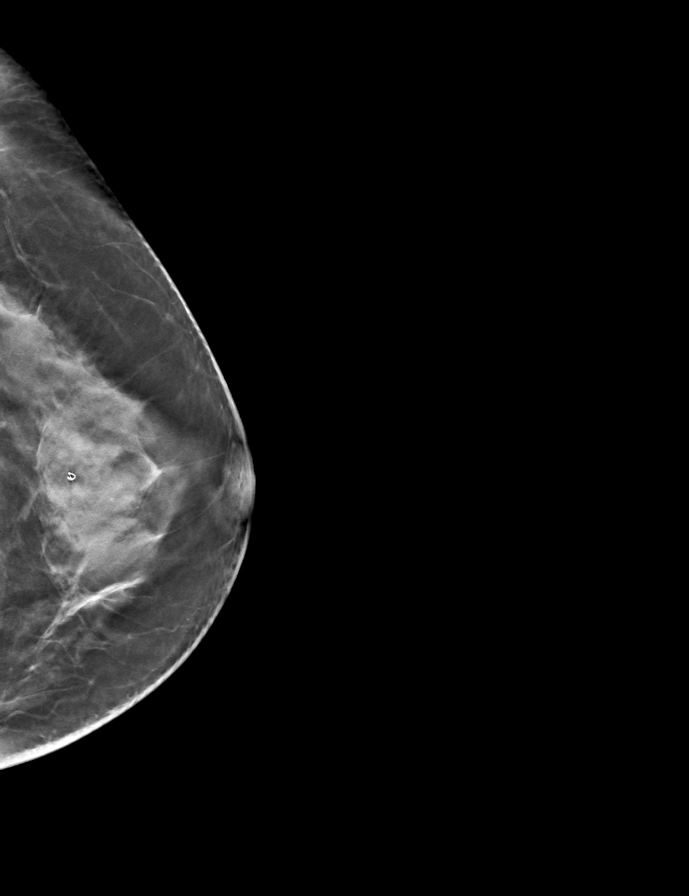

[L MLO tomo · tomo slice 28/55.0]
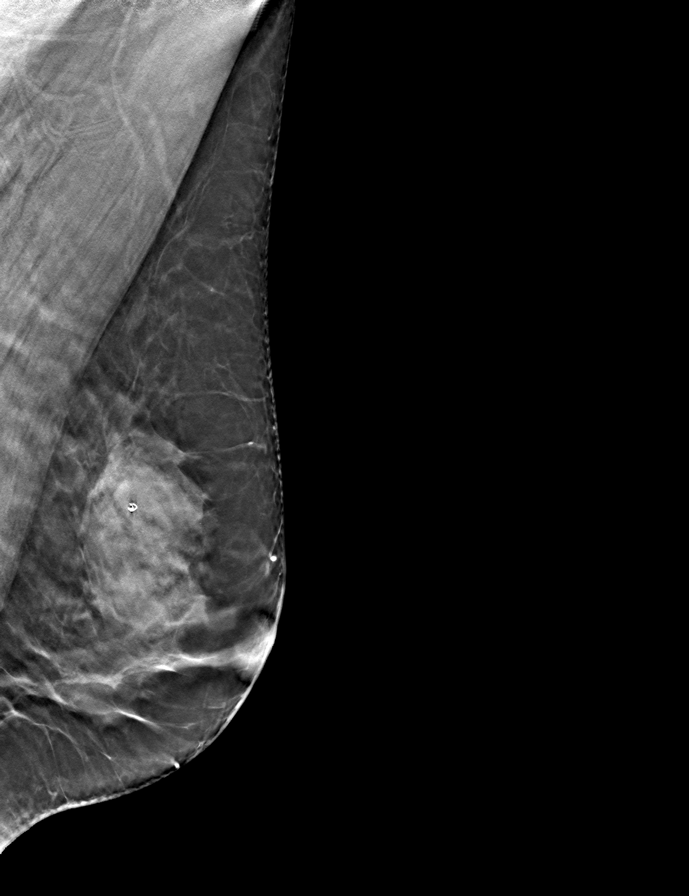

[R MLO tomo · tomo slice 29/57.0]
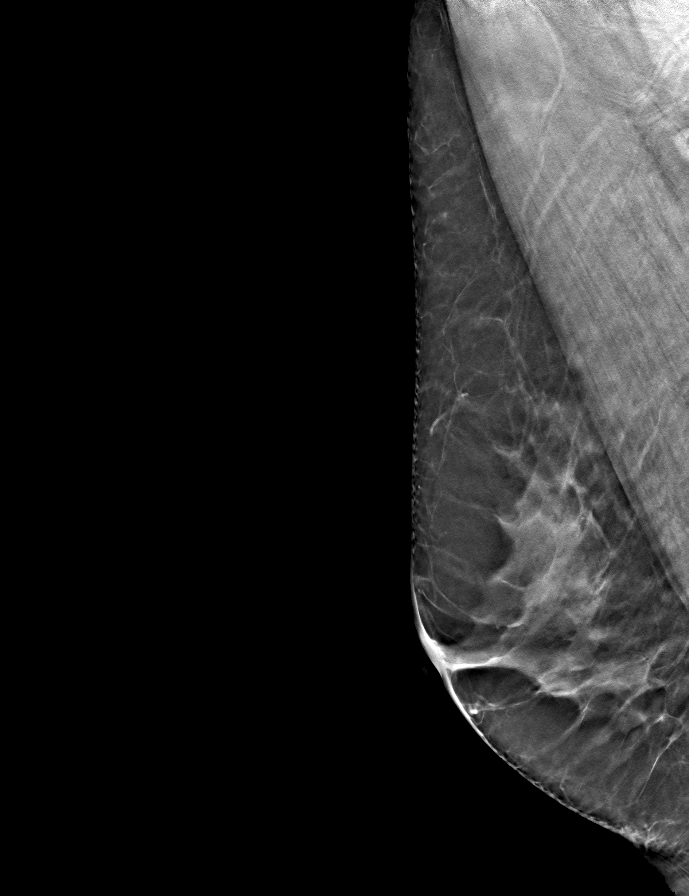

[R CC tomo · tomo slice 34/67.0]
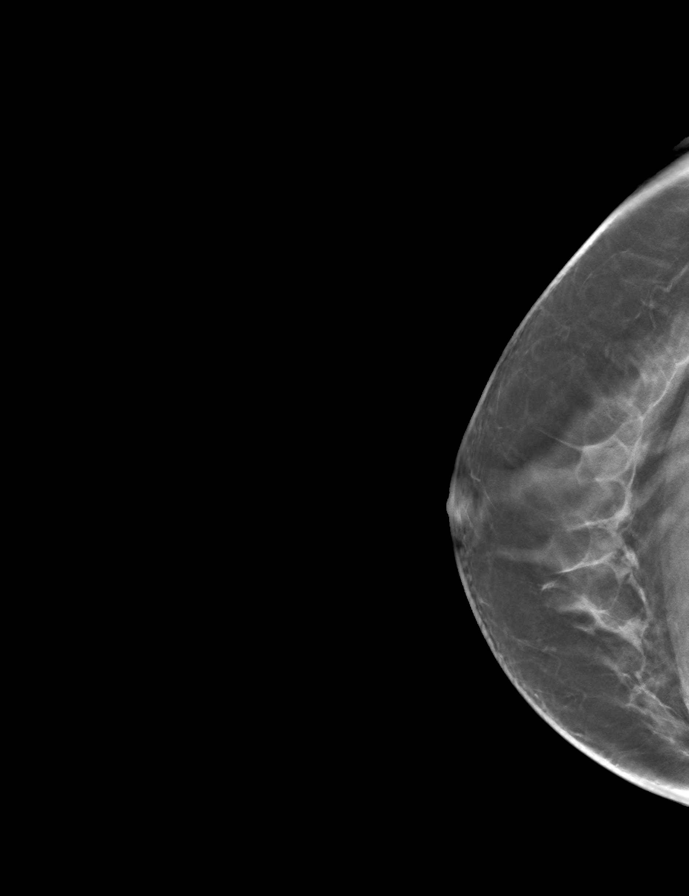

[9 of 24 positions shown; findings below may reference images not displayed]

ACR Breast Density Category c: The breast tissue is heterogeneously
dense, which may obscure small masses.
FINDINGS: There are no findings suspicious for malignancy. Images were
processed with CAD.
IMPRESSION: No mammographic evidence of malignancy. A result letter of this
screening mammogram will be mailed directly to the patient.

RECOMMENDATION:
Screening mammogram in one year. (Code:FT-U-LHB)

BI-RADS CATEGORY  1: Negative.

## 2021-02-22 ENCOUNTER — Other Ambulatory Visit (HOSPITAL_COMMUNITY): Payer: Self-pay | Admitting: Internal Medicine

## 2021-02-22 ENCOUNTER — Other Ambulatory Visit (HOSPITAL_COMMUNITY): Payer: Self-pay | Admitting: Obstetrics & Gynecology

## 2021-02-22 DIAGNOSIS — Z1231 Encounter for screening mammogram for malignant neoplasm of breast: Secondary | ICD-10-CM

## 2021-03-22 ENCOUNTER — Other Ambulatory Visit: Payer: Self-pay

## 2021-03-22 ENCOUNTER — Ambulatory Visit (HOSPITAL_COMMUNITY)
Admission: RE | Admit: 2021-03-22 | Discharge: 2021-03-22 | Disposition: A | Discharge status: Home / Self Care | Payer: BC Managed Care – PPO | Source: Ambulatory Visit | Attending: Obstetrics & Gynecology | Admitting: Obstetrics & Gynecology

## 2021-03-22 DIAGNOSIS — Z1231 Encounter for screening mammogram for malignant neoplasm of breast: Secondary | ICD-10-CM | POA: Insufficient documentation

## 2022-04-26 ENCOUNTER — Encounter: Payer: Self-pay | Admitting: *Deleted

## 2022-05-16 ENCOUNTER — Other Ambulatory Visit (HOSPITAL_COMMUNITY): Payer: Self-pay | Admitting: Internal Medicine

## 2022-05-16 DIAGNOSIS — Z1231 Encounter for screening mammogram for malignant neoplasm of breast: Secondary | ICD-10-CM

## 2022-07-31 ENCOUNTER — Ambulatory Visit (HOSPITAL_COMMUNITY): Payer: BC Managed Care – PPO

## 2022-08-02 ENCOUNTER — Ambulatory Visit (HOSPITAL_COMMUNITY)
Admission: RE | Admit: 2022-08-02 | Discharge: 2022-08-02 | Disposition: A | Discharge status: Home / Self Care | Payer: BC Managed Care – PPO | Source: Ambulatory Visit | Attending: Internal Medicine | Admitting: Internal Medicine

## 2022-08-02 DIAGNOSIS — Z1231 Encounter for screening mammogram for malignant neoplasm of breast: Secondary | ICD-10-CM | POA: Diagnosis present

## 2022-10-11 ENCOUNTER — Encounter: Payer: Self-pay | Admitting: *Deleted

## 2022-10-31 ENCOUNTER — Other Ambulatory Visit (HOSPITAL_COMMUNITY): Payer: Self-pay | Admitting: Internal Medicine

## 2022-10-31 DIAGNOSIS — H532 Diplopia: Secondary | ICD-10-CM

## 2022-11-04 ENCOUNTER — Ambulatory Visit (HOSPITAL_COMMUNITY)
Admission: RE | Admit: 2022-11-04 | Discharge: 2022-11-04 | Disposition: A | Discharge status: Home / Self Care | Payer: BC Managed Care – PPO | Source: Ambulatory Visit | Attending: Internal Medicine | Admitting: Internal Medicine

## 2022-11-04 DIAGNOSIS — H532 Diplopia: Secondary | ICD-10-CM | POA: Diagnosis not present

## 2022-11-04 MED ORDER — GADOBUTROL 1 MMOL/ML IV SOLN
5.0000 mL | Freq: Once | INTRAVENOUS | Status: AC | PRN
  Administered 2022-11-04: 5 mL via INTRAVENOUS

## 2022-11-08 ENCOUNTER — Other Ambulatory Visit (HOSPITAL_COMMUNITY): Payer: Self-pay | Admitting: Internal Medicine

## 2022-11-08 DIAGNOSIS — H532 Diplopia: Secondary | ICD-10-CM

## 2022-11-11 ENCOUNTER — Ambulatory Visit (HOSPITAL_COMMUNITY)
Admission: RE | Admit: 2022-11-11 | Discharge: 2022-11-11 | Disposition: A | Discharge status: Home / Self Care | Payer: BC Managed Care – PPO | Source: Ambulatory Visit | Attending: Internal Medicine | Admitting: Internal Medicine

## 2022-11-11 DIAGNOSIS — H532 Diplopia: Secondary | ICD-10-CM | POA: Diagnosis present

## 2022-11-11 MED ORDER — GADOBUTROL 1 MMOL/ML IV SOLN
5.0000 mL | Freq: Once | INTRAVENOUS | Status: AC | PRN
  Administered 2022-11-11: 5 mL via INTRAVENOUS

## 2022-11-13 NOTE — Progress Notes (Unsigned)
[facility]  PATIENT: Crystal Duffy DOB: [birth year]  REFERRING DOCTOR OR PCP: Asencion Noble, MD' Dr. Wyatt Portela SOURCE: Patient, notes from primary care, imaging reports, MRI images personally reviewed.  _________________________________   HISTORICAL  CHIEF COMPLAINT:  Chief Complaint  Patient presents with   New Patient (Initial Visit)    RM 10,alone. Paper referral. Has developed double vision. Started a couple weeks ago that is constant. Towards end of 09/2022, developed GI issue.  Has not had solid BM since. Tried OTC meds. Sinuses getting dried out after taking meds. Got pain in temple area behind right eye. Took ibuprofen prn. This helped but had to take q4hr. Lasted for 1.5 days. Extremely tired after this. Double vision started after this. Can close one eye and vision better. Only has double vision when both eyes open.    Imaging    Had MRI orbits 11/04/22 and MRI brain 11/11/22.     HISTORY OF PRESENT ILLNESS:  I had the pleasure to see your patient, Crystal Duffy, at [facility] for neurologic consultation regarding her diplopia.  She had the onset of diplopia mid March  She had a headache 10/25/2022 starting October 25, 2022 behind the right eye.   The next day the headache persisted.   The following day on 10/28/2026 while driving onto work , the headache resolved but she noted diplopia with distant vision.  The diplopia was binocular.   She had no diplopia with reading.    She was referred to Dr. Wyatt Portela who recommended an MRi.  She had an MRi of the orbit 11/04/2022.  I reviewed the images and it is normal.  She had an MRI of the brain 11/11/2022 and I reviewed those images.  She has just a couple T2/FLAIR hypertense foci in the white matter consistent with minimal age-appropriate chronic microvascular ischemic change.  The brainstem appears normal.  She feels the diplopia is about the same this week as last week.   She notes a mild  achiness around the right eye but no definite headache.   She had prisms made for her glasses and that has helped.  She noted the onset of diarrhea a few weeks before the symptoms and was taking Alka Seltzer Plus a tthe time of the headache.    She also noted mils sinus pain the day before the headache started,  She was told she is prediabetic.  No HTN.    She has some seasonal allergies.  She is otherwise healthy.  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   Not on any daily medication..  She had a concussion as a teenager when she fell off a horse.    No recent trauma.   No autoimmune disorders.      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  A small pineal cyst was noted.  MRI of the orbits 11/04/2022 was unremarkable.  REVIEW OF SYSTEMS: Constitutional: No fevers, chills, sweats, or change in appetite Eyes: No visual changes, double vision, eye pain Ear, nose and throat: No hearing loss, ear pain, nasal congestion, sore throat Cardiovascular: No chest pain, palpitations Respiratory:  No shortness of breath at rest or with exertion.   No wheezes GastrointestinaI: No nausea, vomiting, diarrhea, abdominal pain, fecal incontinence Genitourinary:  No dysuria, urinary retention or frequency.  No nocturia.  Musculoskeletal:  No neck pain, back pain Integumentary: No rash, pruritus, skin lesions Neurological: as above Psychiatric: No depression at this time.  No anxiety Endocrine: No palpitations, diaphoresis, change in appetite, change in weigh or increased thirst Hematologic/Lymphatic:  No anemia, purpura, petechiae. Allergic/Immunologic: No itchy/runny eyes, nasal congestion, recent allergic reactions, rashes  ALLERGIES: Allergies  Allergen Reactions   Other     Tree pollen    HOME  MEDICATIONS:  Current Outpatient Medications:    Ascorbic Acid (VITAMIN C PO), Take 1 tablet by mouth daily., Disp: , Rfl:    cetirizine (ZYRTEC) 10 MG tablet, Take 10 mg by mouth daily., Disp: , Rfl:    Multiple Vitamin (MULTIVITAMIN WITH MINERALS) TABS, Take 1 tablet by mouth daily. Women's Multivitamin, Disp: , Rfl:    Probiotic Product (PROBIOTIC PO), Take 1 capsule by mouth daily., Disp: , Rfl:   PAST MEDICAL HISTORY: Past Medical History:  Diagnosis Date   Anemia     PAST SURGICAL HISTORY: Past Surgical History:  Procedure Laterality Date   DIAGNOSTIC LAPAROSCOPY  ovarian cyst   ENDOMETRIAL ABLATION     LAPAROSCOPIC BILATERAL SALPINGO OOPHERECTOMY Bilateral 04/13/2019   Procedure: LAPAROSCOPIC BILATERAL SALPINGO OOPHORECTOMY;  Surgeon: [name], MD;  Location: [facility];  Service: Gynecology;  Laterality: Bilateral;   OVARIAN CYST REMOVAL Left    2005   TUBAL LIGATION      FAMILY HISTORY: Family History  Problem Relation Age of Onset   Diabetes Father    Heart failure Sister    Diabetes Brother    Diabetes Brother    Heart Problems Maternal Grandmother     SOCIAL HISTORY: Social History   Socioeconomic History   Marital status: Married    Spouse name: Not on file   Number of children: 2   Years of education: Not on file   Highest education level: Master's degree (e.g., MA, MS, MEng, MEd, MSW, MBA)  Occupational History   Not on file  Tobacco Use   Smoking status: Never   Smokeless tobacco: Never  Vaping Use   Vaping Use: Never used  Substance and Sexual Activity   Alcohol use: Yes    Alcohol/week: 4.0 standard drinks of alcohol    Types: 2 Glasses of wine, 2 Cans of beer per week    Comment: 3-4 drinks per week   Drug use: No   Sexual activity: Yes    Birth control/protection: Surgical    Comment: tubal and ablation  Other Topics Concern   Not on file  Social History Narrative   Right handed   Caffeine use: Coffee- 1.5 cup each morning, 1.5  cup in the afternoon   Social Determinants of Health   Financial Resource Strain: Not on file  Food Insecurity: Not on file  Transportation Needs: Not on file  Physical Activity: Not on file  Stress: Not on file  Social Connections: Not on file  Intimate Partner Violence: Not on file       PHYSICAL EXAM  Vitals:   11/14/22 1512  BP: (!) 111/57  Pulse: 64  Weight: 152 lb 9.6 oz (69.2 kg)  Height: 5\' 7"  (1.702 m)    Body mass index is 23.9 kg/m.   General: The patient is well-developed and well-nourished and in no acute distress  HEENT:  Head is [place]/AT.  Sclera are anicteric.  Funduscopic exam shows normal optic discs and retinal vessels.  Neck: No carotid bruits are noted.  The neck is nontender.  Cardiovascular: The heart  has a regular rate and rhythm with a normal S1 and S2. There were no murmurs, gallops or rubs.    Skin: Extremities are without rash or  edema.  Musculoskeletal:  Back is nontender  Neurologic Exam  Mental status: The patient is alert and oriented x 3 at the time of the examination. The patient has apparent normal recent and remote memory, with an apparently normal attention span and concentration ability.   Speech is normal.  Cranial nerves: Extraocular muscles seem normal with routine testing.  However, with red lines testing there was mild separation at primary gaze that increased slightly on right gaze (though stayed the same with left gaze).  It also increased with forward gaze and narrowed with near gaze. Pupils are equal, round, and reactive to light and accomodation.  Visual fields are full.  Facial symmetry is present. There is good facial sensation to soft touch bilaterally.Facial strength is normal.  Trapezius and sternocleidomastoid strength is normal. No dysarthria is noted.  The tongue is midline, and the patient has symmetric elevation of the soft palate. No obvious hearing deficits are noted.  Motor:  Muscle bulk is normal.   Tone is  normal. Strength is  5 / 5 in all 4 extremities.   Sensory: Sensory testing is intact to pinprick, soft touch and vibration sensation in all 4 extremities.  Coordination: Cerebellar testing reveals good finger-nose-finger and heel-to-shin bilaterally.  Gait and station: Station is normal.   Gait is normal. Tandem gait is normal. Romberg is negative.   Reflexes: Deep tendon reflexes are symmetric and normal bilaterally.   Plantar responses are flexor.    DIAGNOSTIC DATA (LABS, IMAGING, TESTING) - I reviewed patient records, labs, notes, testing and imaging myself where available.  Lab Results  Component Value Date   WBC 6.7 04/09/2019   HGB 13.4 04/09/2019   HCT 41.8 04/09/2019   MCV 87.1 04/09/2019   PLT 186 04/09/2019      Component Value Date/Time   NA 139 04/09/2019 1444   K 4.0 04/09/2019 1444   CL 103 04/09/2019 1444   CO2 28 04/09/2019 1444   GLUCOSE 98 04/09/2019 1444   BUN 19 04/09/2019 1444   CREATININE 0.78 04/09/2019 1444   CALCIUM 9.4 04/09/2019 1444   PROT 6.8 04/09/2019 1444   ALBUMIN 4.3 04/09/2019 1444   AST 19 04/09/2019 1444   ALT 11 04/09/2019 1444   ALKPHOS 70 04/09/2019 1444   BILITOT 0.7 04/09/2019 1444   GFRNONAA >60 04/09/2019 1444   GFRAA >60 04/09/2019 1444   No results found for: "CHOL", "HDL", "LDLCALC", "LDLDIRECT", "TRIG", "CHOLHDL" Lab Results  Component Value Date   HGBA1C 5.6 03/17/2013       ASSESSMENT AND PLAN  Diplopia - Plan: Thyroid Panel With TSH, Angiotensin converting enzyme, Hemoglobin A1c  Cranial nerve disorder - Plan: Thyroid Panel With TSH, Angiotensin converting enzyme, Hemoglobin A1c  Hyperglycemia - Plan: Thyroid Panel With TSH, Hemoglobin A1c   In summary, Crystal Duffy is a 67 year old woman with the onset of headache followed by diplopia in mid March 2024.  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  ESR was checked  last week and was normal.  I will check blood work for hemoglobin A1c, TSH/T4 and ACE to assess for other possible causes.  I discussed with her that the vast majority of the time partial cranial nerve palsies causing diplopia will completely resolve.  Hopefully  she will start to see some improvement in the next week or 2.  I did not schedule follow-up but advised her to let us know if he has any worsening of symptoms or if there are any new neurologic symptoms.  Thank you for asking me to see Crystal Duffy.  Please let her know I will be of further assistance with her or other patients in the future.   Crystal Duffy A. Felecia Shelling, MD, [facility] [id number], [id number] PM Certified in Neurology, Clinical Neurophysiology, Sleep Medicine and Neuroimaging  [facility] [address], [place], [place] [address] [phone]

## 2022-11-14 ENCOUNTER — Ambulatory Visit: Payer: BC Managed Care – PPO | Admitting: Neurology

## 2022-11-14 ENCOUNTER — Encounter: Payer: Self-pay | Admitting: Neurology

## 2022-11-14 VITALS — BP 111/57 | HR 64 | Ht 67.0 in | Wt 152.6 lb

## 2022-11-14 DIAGNOSIS — G529 Cranial nerve disorder, unspecified: Secondary | ICD-10-CM

## 2022-11-14 DIAGNOSIS — H532 Diplopia: Secondary | ICD-10-CM | POA: Diagnosis not present

## 2022-11-14 DIAGNOSIS — R739 Hyperglycemia, unspecified: Secondary | ICD-10-CM | POA: Diagnosis not present

## 2022-11-15 LAB — THYROID PANEL WITH TSH
Free Thyroxine Index: 1.6 (ref 1.2–4.9)
T3 Uptake Ratio: 26 % (ref 24–39)
T4, Total: 6 ug/dL (ref 4.5–12.0)
TSH: 1.45 u[IU]/mL (ref 0.450–4.500)

## 2022-11-15 LAB — HEMOGLOBIN A1C
Est. average glucose Bld gHb Est-mCnc: 126 mg/dL
Hgb A1c MFr Bld: 6 % — ABNORMAL HIGH (ref 4.8–5.6)

## 2022-11-15 LAB — ANGIOTENSIN CONVERTING ENZYME: Angio Convert Enzyme: 35 U/L (ref 14–82)

## 2022-12-04 ENCOUNTER — Encounter: Payer: Self-pay | Admitting: *Deleted

## 2023-01-07 ENCOUNTER — Encounter: Payer: Self-pay | Admitting: Gastroenterology

## 2023-01-07 ENCOUNTER — Encounter: Payer: Self-pay | Admitting: *Deleted

## 2023-01-07 ENCOUNTER — Ambulatory Visit: Payer: BC Managed Care – PPO | Admitting: Gastroenterology

## 2023-01-07 VITALS — BP 121/71 | HR 69 | Temp 97.7°F | Ht 67.0 in | Wt 147.0 lb

## 2023-01-07 DIAGNOSIS — Z1211 Encounter for screening for malignant neoplasm of colon: Secondary | ICD-10-CM

## 2023-01-07 DIAGNOSIS — R197 Diarrhea, unspecified: Secondary | ICD-10-CM | POA: Diagnosis not present

## 2023-01-07 DIAGNOSIS — R194 Change in bowel habit: Secondary | ICD-10-CM

## 2023-01-07 NOTE — Patient Instructions (Addendum)
Continue to avoid any lactose containing products.  I am attaching a handout about a low FODMAP diet which we typically tell people to look at for IBS or with diarrhea.  This does not mean you have to avoid everything on this list but this can help to narrow down any other potential triggers.   We will get you scheduled for a colonoscopy in the near future with Dr. Marletta Lor.  If you have any worsening of symptoms between now and time of your procedure please feel free to reach out via MyChart or call the office.  If your colonoscopy is negative then we can proceed with further evaluation IBS especially stool testing or breath testing that we discussed today in the office.  It was a pleasure to see you today. I want to create trusting relationships with patients. If you receive a survey regarding your visit,  I greatly appreciate you taking time to fill this out on paper or through your MyChart. I value your feedback.  Brooke Bonito, MSN, FNP-BC, AGACNP-BC [facility]

## 2023-01-07 NOTE — Progress Notes (Signed)
GI Office Note    Referring Provider: Carylon Perches, MD Primary Care Physician:  Carylon Perches, MD  Primary Gastroenterologist: Hennie Duos. Marletta Lor, DO (Previously Dr. Darrick Penna)  Chief Complaint   Chief Complaint  Patient presents with   change in bowel habits     Change in bowel habits. Bowels have been loose and watery with some mucous. She has been gassy.    History of Present Illness   Crystal Duffy is a 67 y.o. female presenting today at the request of Carylon Perches, MD for need for colonoscopy and change in bowel habits.  Colonoscopy and EGD in November 2008: -Normal TI and ileocecal valve -Normal colon -Small internal hemorrhoids -Normal esophagus -Gastric atrophy with lack of prominent gastric folds s/p biopsy -Normal duodenum s/p biopsies to evaluate for celiac -Gastric biopsies negative for H. pylori, benign gastric mucosa, benign small bowel mucosa -Advised repeat colonoscopy in 10 years, follow high-fiber diet -Advised to avoid NSAIDs  Labs 11/14/22: TSH within normal limits. ACE normal. A1c 6.  GI profile negative 11/15/22.    Today: Has been having loose stools as well as been gassy.  Has been taking a daily probiotic for years. She tried an electrolyte mix packet within 24 hours of all of this happening.   At the end of February she began having some bloating/gassiness, watery stools with mucous. She was having significant urgency at that time. Has been taking pepto/tums etc to help with relief. Also she tried Catering manager and none of these gave he any relief. Has had many tick bites in the past. Bloating tends to be on the left side. The moment something is there she needs to go. The first BM if the day the stool is more pudding consistency and is usually more explosive at first. Has bene taking probiotic for years. Tried a kieffer probiotic milk at first and that id not seem to help. Usually drinks a lot of milk but quit regular mild for 2-3 weeks or more and drinking  lactose free milk and did see a mild improvement initially. Tried imodium in the beginning and that did not sem to help either. She queries if this could be diabetes related.   She may go 3-4 times in the morning and then she is fine for the rest of the day. Sometimes she may go another time in the afternoon. Lately not as much gassiness in the evenings. Staying away from dairy. About a week or so ago she has been trying to avoid gluten. They are seeming to help but has introduced a little back here and there and severe symptoms have not returned. Unable to pinpoint any specific triggers. About a week after all of those symptoms started she had a significant headache that started and was very fatigued and slept a lot. When she ca,e out of all that she felt dehydrated and then worked on drinking more liquids. After that she developed diplopia and that lasted 2 weeks. She states drs though it was due to some sort of infection.  No issues with headaches or vision since then. MRIs have been unremarkable.   Denies any issues with constipation in the past.   Denies chest pain, shortness of breath, melena, brbrpr.    Current Outpatient Medications  Medication Sig Dispense Refill   Probiotic Product (PROBIOTIC PO) Take 1 capsule by mouth daily.     No current facility-administered medications for this visit.    Past Medical History:  Diagnosis Date  Anemia     Past Surgical History:  Procedure Laterality Date   DIAGNOSTIC LAPAROSCOPY  ovarian cyst   ENDOMETRIAL ABLATION     LAPAROSCOPIC BILATERAL SALPINGO OOPHERECTOMY Bilateral 04/13/2019   Procedure: LAPAROSCOPIC BILATERAL SALPINGO OOPHORECTOMY;  Surgeon: Tilda Burrow, MD;  Location: [facility];  Service: Gynecology;  Laterality: Bilateral;   OVARIAN CYST REMOVAL Left    2005   TUBAL LIGATION      Family History  Problem Relation Age of Onset   Diabetes Father    Heart failure Sister    Diabetes Brother    Diabetes Brother    Heart  Problems Maternal Grandmother     Allergies as of 01/07/2023 - Review Complete 01/07/2023  Allergen Reaction Noted   Other  11/14/2022    Social History   Socioeconomic History   Marital status: Married    Spouse name: Not on file   Number of children: 2   Years of education: Not on file   Highest education level: Master's degree (e.g., MA, MS, MEng, MEd, MSW, MBA)  Occupational History   Not on file  Tobacco Use   Smoking status: Never   Smokeless tobacco: Never  Vaping Use   Vaping Use: Never used  Substance and Sexual Activity   Alcohol use: Not Currently    Alcohol/week: 4.0 standard drinks of alcohol    Types: 2 Glasses of wine, 2 Cans of beer per week    Comment: 3-4 drinks per week   Drug use: No   Sexual activity: Yes    Birth control/protection: Surgical    Comment: tubal and ablation  Other Topics Concern   Not on file  Social History Narrative   Right handed   Caffeine use: Coffee- 1.5 cup each morning, 1.5 cup in the afternoon   Social Determinants of Health   Financial Resource Strain: Not on file  Food Insecurity: Not on file  Transportation Needs: Not on file  Physical Activity: Not on file  Stress: Not on file  Social Connections: Not on file  Intimate Partner Violence: Not on file     Review of Systems   Gen: Denies any fever, chills, fatigue, weight loss, lack of appetite.  CV: Denies chest pain, heart palpitations, peripheral edema, syncope.  Resp: Denies shortness of breath at rest or with exertion. Denies wheezing or cough.  GI: see HPI GU : Denies urinary burning, urinary frequency, urinary hesitancy MS: Denies joint pain, muscle weakness, cramps, or limitation of movement.  Derm: Denies rash, itching, dry skin Psych: Denies depression, anxiety, memory loss, and confusion Heme: Denies bruising, bleeding, and enlarged lymph nodes.   Physical Exam   BP 121/71 (BP Location: Right Arm, Patient Position: Sitting, Cuff Size: Normal)    Pulse 69   Temp 97.7 F (36.5 C) (Temporal)   Ht 5\' 7"  (1.702 m)   Wt 147 lb (66.7 kg)   BMI 23.02 kg/m   General:   Alert and oriented. Pleasant and cooperative. Well-nourished and well-developed.  Head:  Normocephalic and atraumatic. Eyes:  Without icterus, sclera clear and conjunctiva pink.  Ears:  Normal auditory acuity. Mouth:  No deformity or lesions, oral mucosa pink.  Lungs:  Clear to auscultation bilaterally. No wheezes, rales, or rhonchi. No distress.  Heart:  S1, S2 present without murmurs appreciated.  Abdomen:  +BS, soft, non-tender and non-distended. No HSM noted. No guarding or rebound. No masses appreciated.  Rectal:  Deferred  Msk:  Symmetrical without gross deformities. Normal posture. Extremities:  Without edema. Neurologic:  Alert and  oriented x4;  grossly normal neurologically. Skin:  Intact without significant lesions or rashes. Psych:  Alert and cooperative. Normal mood and affect.   Assessment   Crystal Duffy is a 68 y.o. female with a history of anemia and bilateral salpingo-oophorectomy in 2020 presenting today with change in bowel habits.  Screening colon cancer: Last colonoscopy in November 2008 completely normal other than internal hemorrhoids.  Denies any melena, BRBPR, lack of appetite, early satiety, unintentional weight loss.  Currently well overdue for screening colonoscopy.  She has had no change in bowel habits to diarrhea as noted below.  Change in bowel habits - diarrhea: Since then in February she has developed significant urgency with looser/mushy stools described as a pudding consistency.  Initially this was occurring multiple times per day but over the last several weeks she will go about 3-4 times in the mornings and then be good for the rest of the day.  She has tried Pepto, Tums, Alka-Seltzer, and Imodium initially without good relief but has not taken anything for a long time.  Has been maintained on probiotic for years but continues to  have gassiness at times with her diarrhea.  Prior lab workup with negative GI profile and normal thyroid.  She does have exposures to ticks but denies any worsening of diarrhea or any abdominal pain with eating red meats.  Symptoms likely exacerbated by diet as she has noticed improvement with avoiding whole milk and drinking Lactaid.  No risk factors for pancreatic insufficiency) less likely.  Celiac also less likely given her age as well as negative EGD in 2008 for celiac.  Denies any issues with constipation prior to the symptoms beginning therefore this is not likely overflow.  Possible SIBO therefore we can consider testing for this or empiric trial of Xifaxan if colonoscopy negative.  Malignancy remains within differential although she is not having any other alarm symptoms.  Will proceed with colonoscopy given she is due for colon cancer screening as well as having diarrhea.  Would recommend random biopsies to assess for microscopic colitis.  PLAN   Low FODMAP diet Continue to avoid lactose. Could consider fecal calprotectin, SIBO testing, alpha gal, fecal elastase in the future Proceed with colonoscopy with propofol by Dr. Marletta Lor in near future: the risks, benefits, and alternatives have been discussed with the patient in detail. The patient states understanding and desires to proceed. ASA 2 Follow up 6-8 weeks post procedure.Brooke Bonito, MSN, FNP-BC, AGACNP-BC [facility]

## 2023-01-07 NOTE — H&P (View-Only) (Signed)
  GI Office Note    Referring Provider: Fagan, Roy, MD Primary Care Physician:  Fagan, Roy, MD  Primary Gastroenterologist: Charles K. Carver, DO (Previously Dr. Fields)  Chief Complaint   Chief Complaint  Patient presents with   change in bowel habits     Change in bowel habits. Bowels have been loose and watery with some mucous. She has been gassy.    History of Present Illness   Crystal Duffy is a 66 y.o. female presenting today at the request of Fagan, Roy, MD for need for colonoscopy and change in bowel habits.  Colonoscopy and EGD in November 2008: -Normal TI and ileocecal valve -Normal colon -Small internal hemorrhoids -Normal esophagus -Gastric atrophy with lack of prominent gastric folds s/p biopsy -Normal duodenum s/p biopsies to evaluate for celiac -Gastric biopsies negative for H. pylori, benign gastric mucosa, benign small bowel mucosa -Advised repeat colonoscopy in 10 years, follow high-fiber diet -Advised to avoid NSAIDs  Labs 11/14/22: TSH within normal limits. ACE normal. A1c 6.  GI profile negative 11/15/22.    Today: Has been having loose stools as well as been gassy.  Has been taking a daily probiotic for years. She tried an electrolyte mix packet within 24 hours of all of this happening.   At the end of February she began having some bloating/gassiness, watery stools with mucous. She was having significant urgency at that time. Has been taking pepto/tums etc to help with relief. Also she tried alka seltzer and none of these gave he any relief. Has had many tick bites in the past. Bloating tends to be on the left side. The moment something is there she needs to go. The first BM if the day the stool is more pudding consistency and is usually more explosive at first. Has bene taking probiotic for years. Tried a kieffer probiotic milk at first and that id not seem to help. Usually drinks a lot of milk but quit regular mild for 2-3 weeks or more and drinking  lactose free milk and did see a mild improvement initially. Tried imodium in the beginning and that did not sem to help either. She queries if this could be diabetes related.   She may go 3-4 times in the morning and then she is fine for the rest of the day. Sometimes she may go another time in the afternoon. Lately not as much gassiness in the evenings. Staying away from dairy. About a week or so ago she has been trying to avoid gluten. They are seeming to help but has introduced a little back here and there and severe symptoms have not returned. Unable to pinpoint any specific triggers. About a week after all of those symptoms started she had a significant headache that started and was very fatigued and slept a lot. When she ca,e out of all that she felt dehydrated and then worked on drinking more liquids. After that she developed diplopia and that lasted 2 weeks. She states drs though it was due to some sort of infection.  No issues with headaches or vision since then. MRIs have been unremarkable.   Denies any issues with constipation in the past.   Denies chest pain, shortness of breath, melena, brbrpr.    Current Outpatient Medications  Medication Sig Dispense Refill   Probiotic Product (PROBIOTIC PO) Take 1 capsule by mouth daily.     No current facility-administered medications for this visit.    Past Medical History:  Diagnosis Date     Anemia     Past Surgical History:  Procedure Laterality Date   DIAGNOSTIC LAPAROSCOPY  ovarian cyst   ENDOMETRIAL ABLATION     LAPAROSCOPIC BILATERAL SALPINGO OOPHERECTOMY Bilateral 04/13/2019   Procedure: LAPAROSCOPIC BILATERAL SALPINGO OOPHORECTOMY;  Surgeon: Ferguson, John V, MD;  Location: [facility];  Service: Gynecology;  Laterality: Bilateral;   OVARIAN CYST REMOVAL Left    2005   TUBAL LIGATION      Family History  Problem Relation Age of Onset   Diabetes Father    Heart failure Sister    Diabetes Brother    Diabetes Brother    Heart  Problems Maternal Grandmother     Allergies as of 01/07/2023 - Review Complete 01/07/2023  Allergen Reaction Noted   Other  11/14/2022    Social History   Socioeconomic History   Marital status: Married    Spouse name: Not on file   Number of children: 2   Years of education: Not on file   Highest education level: Master's degree (e.g., MA, MS, MEng, MEd, MSW, MBA)  Occupational History   Not on file  Tobacco Use   Smoking status: Never   Smokeless tobacco: Never  Vaping Use   Vaping Use: Never used  Substance and Sexual Activity   Alcohol use: Not Currently    Alcohol/week: 4.0 standard drinks of alcohol    Types: 2 Glasses of wine, 2 Cans of beer per week    Comment: 3-4 drinks per week   Drug use: No   Sexual activity: Yes    Birth control/protection: Surgical    Comment: tubal and ablation  Other Topics Concern   Not on file  Social History Narrative   Right handed   Caffeine use: Coffee- 1.5 cup each morning, 1.5 cup in the afternoon   Social Determinants of Health   Financial Resource Strain: Not on file  Food Insecurity: Not on file  Transportation Needs: Not on file  Physical Activity: Not on file  Stress: Not on file  Social Connections: Not on file  Intimate Partner Violence: Not on file     Review of Systems   Gen: Denies any fever, chills, fatigue, weight loss, lack of appetite.  CV: Denies chest pain, heart palpitations, peripheral edema, syncope.  Resp: Denies shortness of breath at rest or with exertion. Denies wheezing or cough.  GI: see HPI GU : Denies urinary burning, urinary frequency, urinary hesitancy MS: Denies joint pain, muscle weakness, cramps, or limitation of movement.  Derm: Denies rash, itching, dry skin Psych: Denies depression, anxiety, memory loss, and confusion Heme: Denies bruising, bleeding, and enlarged lymph nodes.   Physical Exam   BP 121/71 (BP Location: Right Arm, Patient Position: Sitting, Cuff Size: Normal)    Pulse 69   Temp 97.7 F (36.5 C) (Temporal)   Ht 5' 7" (1.702 m)   Wt 147 lb (66.7 kg)   BMI 23.02 kg/m   General:   Alert and oriented. Pleasant and cooperative. Well-nourished and well-developed.  Head:  Normocephalic and atraumatic. Eyes:  Without icterus, sclera clear and conjunctiva pink.  Ears:  Normal auditory acuity. Mouth:  No deformity or lesions, oral mucosa pink.  Lungs:  Clear to auscultation bilaterally. No wheezes, rales, or rhonchi. No distress.  Heart:  S1, S2 present without murmurs appreciated.  Abdomen:  +BS, soft, non-tender and non-distended. No HSM noted. No guarding or rebound. No masses appreciated.  Rectal:  Deferred  Msk:  Symmetrical without gross deformities. Normal posture. Extremities:    Without edema. Neurologic:  Alert and  oriented x4;  grossly normal neurologically. Skin:  Intact without significant lesions or rashes. Psych:  Alert and cooperative. Normal mood and affect.   Assessment   Crystal Duffy is a 66 y.o. female with a history of anemia and bilateral salpingo-oophorectomy in 2020 presenting today with change in bowel habits.  Screening colon cancer: Last colonoscopy in November 2008 completely normal other than internal hemorrhoids.  Denies any melena, BRBPR, lack of appetite, early satiety, unintentional weight loss.  Currently well overdue for screening colonoscopy.  She has had no change in bowel habits to diarrhea as noted below.  Change in bowel habits - diarrhea: Since then in February she has developed significant urgency with looser/mushy stools described as a pudding consistency.  Initially this was occurring multiple times per day but over the last several weeks she will go about 3-4 times in the mornings and then be good for the rest of the day.  She has tried Pepto, Tums, Alka-Seltzer, and Imodium initially without good relief but has not taken anything for a long time.  Has been maintained on probiotic for years but continues to  have gassiness at times with her diarrhea.  Prior lab workup with negative GI profile and normal thyroid.  She does have exposures to ticks but denies any worsening of diarrhea or any abdominal pain with eating red meats.  Symptoms likely exacerbated by diet as she has noticed improvement with avoiding whole milk and drinking Lactaid.  No risk factors for pancreatic insufficiency) less likely.  Celiac also less likely given her age as well as negative EGD in 2008 for celiac.  Denies any issues with constipation prior to the symptoms beginning therefore this is not likely overflow.  Possible SIBO therefore we can consider testing for this or empiric trial of Xifaxan if colonoscopy negative.  Malignancy remains within differential although she is not having any other alarm symptoms.  Will proceed with colonoscopy given she is due for colon cancer screening as well as having diarrhea.  Would recommend random biopsies to assess for microscopic colitis.  PLAN   Low FODMAP diet Continue to avoid lactose. Could consider fecal calprotectin, SIBO testing, alpha gal, fecal elastase in the future Proceed with colonoscopy with propofol by Dr. Carver in near future: the risks, benefits, and alternatives have been discussed with the patient in detail. The patient states understanding and desires to proceed. ASA 2 Follow up 6-8 weeks post procedure..    Joda Braatz, MSN, FNP-BC, AGACNP-BC [facility] 

## 2023-01-21 ENCOUNTER — Encounter (HOSPITAL_COMMUNITY): Payer: Self-pay

## 2023-01-21 ENCOUNTER — Other Ambulatory Visit: Payer: Self-pay

## 2023-01-21 ENCOUNTER — Ambulatory Visit (HOSPITAL_COMMUNITY)
Admission: RE | Admit: 2023-01-21 | Discharge: 2023-01-21 | Disposition: A | Discharge status: Home / Self Care | Payer: BC Managed Care – PPO | Attending: Internal Medicine | Admitting: Internal Medicine

## 2023-01-21 ENCOUNTER — Ambulatory Visit (HOSPITAL_COMMUNITY): Payer: BC Managed Care – PPO | Admitting: Anesthesiology

## 2023-01-21 ENCOUNTER — Encounter (HOSPITAL_COMMUNITY)
Admission: RE | Disposition: A | Discharge status: Home / Self Care | Payer: Self-pay | Source: Home / Self Care | Attending: Internal Medicine

## 2023-01-21 DIAGNOSIS — K529 Noninfective gastroenteritis and colitis, unspecified: Secondary | ICD-10-CM | POA: Diagnosis not present

## 2023-01-21 DIAGNOSIS — K648 Other hemorrhoids: Secondary | ICD-10-CM | POA: Insufficient documentation

## 2023-01-21 DIAGNOSIS — Z1211 Encounter for screening for malignant neoplasm of colon: Secondary | ICD-10-CM | POA: Insufficient documentation

## 2023-01-21 DIAGNOSIS — R194 Change in bowel habit: Secondary | ICD-10-CM

## 2023-01-21 HISTORY — PX: BIOPSY: SHX5522

## 2023-01-21 HISTORY — PX: COLONOSCOPY WITH PROPOFOL: SHX5780

## 2023-01-21 SURGERY — COLONOSCOPY WITH PROPOFOL
Anesthesia: General

## 2023-01-21 MED ORDER — LACTATED RINGERS IV SOLN: INTRAVENOUS | Status: DC

## 2023-01-21 MED ORDER — PROPOFOL 10 MG/ML IV BOLUS
INTRAVENOUS | Status: DC | PRN
  Administered 2023-01-21: 70 mg via INTRAVENOUS
  Administered 2023-01-21: 30 mg via INTRAVENOUS

## 2023-01-21 MED ORDER — EPHEDRINE SULFATE-NACL 50-0.9 MG/10ML-% IV SOSY
PREFILLED_SYRINGE | INTRAVENOUS | Status: DC | PRN
  Administered 2023-01-21 (×2): 10 mg via INTRAVENOUS

## 2023-01-21 MED ORDER — PROPOFOL 500 MG/50ML IV EMUL
INTRAVENOUS | Status: DC | PRN
  Administered 2023-01-21: 125 ug/kg/min via INTRAVENOUS

## 2023-01-21 MED ORDER — LIDOCAINE HCL (CARDIAC) PF 100 MG/5ML IV SOSY
PREFILLED_SYRINGE | INTRAVENOUS | Status: DC | PRN
  Administered 2023-01-21: 60 mg via INTRAVENOUS

## 2023-01-21 NOTE — Op Note (Signed)
[facility] Patient Name: Crystal Duffy Procedure Date: 01/21/2023 1:02 PM MRN: [id number] Date of Birth: 1956/08/11 Attending MD: Hennie Duos. Marletta Lor , [place], [id number] CSN: [id number] Age: 67 Admit Type: Outpatient Procedure:                Colonoscopy Indications:              Screening for colorectal malignant neoplasm,                            Incidental - Chronic diarrhea Providers:                Hennie Duos. Marletta Lor, DO, Sheran Fava, Dyann Ruddle Referring MD:             Hennie Duos. Marletta Lor, DO Medicines:                See the Anesthesia note for documentation of the                            administered medications Complications:            No immediate complications. Estimated Blood Loss:     Estimated blood loss was minimal. Procedure:                Pre-Anesthesia Assessment:                           - The anesthesia plan was to use monitored                            anesthesia care (MAC).                           After obtaining informed consent, the colonoscope                            was passed under direct vision. Throughout the                            procedure, the patient's blood pressure, pulse, and                            oxygen saturations were monitored continuously. The                            [id number]-HQ190L ([id number]) scope was introduced through                            the anus and advanced to the the terminal ileum,                            with identification of the appendiceal orifice and                            IC valve. The colonoscopy was performed  without                            difficulty. The patient tolerated the procedure                            well. The quality of the bowel preparation was                            evaluated using the BBPS [facility] Bowel Preparation                            Scale) with scores of: Right Colon = 2 (minor                            amount of residual  staining, small fragments of                            stool and/or opaque liquid, but mucosa seen well),                            Transverse Colon = 3 (entire mucosa seen well with                            no residual staining, small fragments of stool or                            opaque liquid) and Left Colon = 3 (entire mucosa                            seen well with no residual staining, small                            fragments of stool or opaque liquid). The total                            BBPS score equals 8. The quality of the bowel                            preparation was good. Scope In: 1:22:46 PM Scope Out: 1:39:28 PM Scope Withdrawal Time: 0 hours 12 minutes 10 seconds  Total Procedure Duration: 0 hours 16 minutes 42 seconds  Findings:      Non-bleeding internal hemorrhoids were found during endoscopy.      The terminal ileum appeared normal.      Biopsies for histology were taken with a cold forceps from the ascending       colon, transverse colon and descending colon for evaluation of       microscopic colitis.      The exam was otherwise without abnormality. Impression:               - Non-bleeding internal hemorrhoids.                           - The  examined portion of the ileum was normal.                           - The examination was otherwise normal.                           - Biopsies were taken with a cold forceps from the                            ascending colon, transverse colon and descending                            colon for evaluation of microscopic colitis. Moderate Sedation:      Per Anesthesia Care Recommendation:           - Patient has a contact number available for                            emergencies. The signs and symptoms of potential                            delayed complications were discussed with the                            patient. Return to normal activities tomorrow.                            Written discharge  instructions were provided to the                            patient.                           - Resume previous diet.                           - Continue present medications.                           - Await pathology results.                           - Repeat colonoscopy in 10 years for screening                            purposes.                           - Return to GI clinic in 2 months. Procedure Code(s):        --- Professional ---                           [phone], Colonoscopy, flexible; with biopsy, single                            or multiple Diagnosis Code(s):        ---  Professional ---                           Z12.11, Encounter for screening for malignant                            neoplasm of colon                           K64.8, Other hemorrhoids CPT copyright 2022 American Medical Association. All rights reserved. The codes documented in this report are preliminary and upon coder review may  be revised to meet current compliance requirements. Hennie Duos. Marletta Lor, DO Hennie Duos. Marletta Lor, DO 01/21/2023 1:42:50 PM This report has been signed electronically. Number of Addenda: 0

## 2023-01-21 NOTE — Interval H&P Note (Signed)
History and Physical Interval Note:  01/21/2023 12:16 PM  Crystal Duffy  has presented today for surgery, with the diagnosis of screening colon cancer,change in bowel habits,diarrhea.  The various methods of treatment have been discussed with the patient and family. After consideration of risks, benefits and other options for treatment, the patient has consented to  Procedure(s) with comments: COLONOSCOPY WITH PROPOFOL (N/A) - 1:45 pm, asa 2 as a surgical intervention.  The patient's history has been reviewed, patient examined, no change in status, stable for surgery.  I have reviewed the patient's chart and labs.  Questions were answered to the patient's satisfaction.     [name]

## 2023-01-21 NOTE — Anesthesia Preprocedure Evaluation (Addendum)
Anesthesia Evaluation  Patient identified by MRN, date of birth, ID band Patient awake    Reviewed: Allergy & Precautions, H&P , NPO status , Patient's Chart, lab work & pertinent test results  Airway Mallampati: II  TM Distance: <3 FB Neck ROM: Full    Dental no notable dental hx. (+) Teeth Intact, Dental Advisory Given Crowns  :   Pulmonary neg pulmonary ROS   Pulmonary exam normal breath sounds clear to auscultation       Cardiovascular negative cardio ROS Normal cardiovascular exam Rhythm:Regular Rate:Normal     Neuro/Psych negative neurological ROS  negative psych ROS   GI/Hepatic negative GI ROS, Neg liver ROS,,,  Endo/Other  negative endocrine ROS    Renal/GU negative Renal ROS  negative genitourinary   Musculoskeletal negative musculoskeletal ROS (+)    Abdominal   Peds negative pediatric ROS (+)  Hematology  (+) Blood dyscrasia, anemia   Anesthesia Other Findings   Reproductive/Obstetrics negative OB ROS                             Anesthesia Physical Anesthesia Plan  ASA: 2  Anesthesia Plan: General   Post-op Pain Management: Minimal or no pain anticipated   Induction: Intravenous  PONV Risk Score and Plan: Treatment may vary due to age or medical condition  Airway Management Planned: Nasal Cannula and Natural Airway  Additional Equipment:   Intra-op Plan:   Post-operative Plan:   Informed Consent: I have reviewed the patients History and Physical, chart, labs and discussed the procedure including the risks, benefits and alternatives for the proposed anesthesia with the patient or authorized representative who has indicated his/her understanding and acceptance.     Dental advisory given  Plan Discussed with: CRNA and Surgeon  Anesthesia Plan Comments:        Anesthesia Quick Evaluation

## 2023-01-21 NOTE — Transfer of Care (Addendum)
Immediate Anesthesia Transfer of Care Note  Patient: Crystal Duffy  Procedure(s) Performed: COLONOSCOPY WITH PROPOFOL BIOPSY  Patient Location: PACU  Anesthesia Type:General  Level of Consciousness: drowsy and patient cooperative  Airway & Oxygen Therapy: Patient Spontanous Breathing  Post-op Assessment: Report given to RN and Post -op Vital signs reviewed and stable  Post vital signs: Reviewed   Last Vitals:  Vitals Value Taken Time  BP 93/57 01/21/2023    1344  Temp 36.4 01/21/2023    1344  Pulse 90 01/21/2023    1344  Resp 18 01/21/2023    1344  SpO2 100% 01/21/2023    1344    Last Pain:  Vitals:   01/21/23 1318  TempSrc:   PainSc: 0-No pain      Patients Stated Pain Goal: 5 (01/21/23 1204)  Complications: No notable events documented.

## 2023-01-21 NOTE — Discharge Instructions (Addendum)
  Colonoscopy Discharge Instructions  Read the instructions outlined below and refer to this sheet in the next few weeks. These discharge instructions provide you with general information on caring for yourself after you leave the hospital. Your doctor may also give you specific instructions. While your treatment has been planned according to the most current medical practices available, unavoidable complications occasionally occur.   ACTIVITY You may resume your regular activity, but move at a slower pace for the next 24 hours.  Take frequent rest periods for the next 24 hours.  Walking will help get rid of the air and reduce the bloated feeling in your belly (abdomen).  No driving for 24 hours (because of the medicine (anesthesia) used during the test).   Do not sign any important legal documents or operate any machinery for 24 hours (because of the anesthesia used during the test).  NUTRITION Drink plenty of fluids.  You may resume your normal diet as instructed by your doctor.  Begin with a light meal and progress to your normal diet. Heavy or fried foods are harder to digest and may make you feel sick to your stomach (nauseated).  Avoid alcoholic beverages for 24 hours or as instructed.  MEDICATIONS You may resume your normal medications unless your doctor tells you otherwise.  WHAT YOU CAN EXPECT TODAY Some feelings of bloating in the abdomen.  Passage of more gas than usual.  Spotting of blood in your stool or on the toilet paper.  IF YOU HAD POLYPS REMOVED DURING THE COLONOSCOPY: No aspirin products for 7 days or as instructed.  No alcohol for 7 days or as instructed.  Eat a soft diet for the next 24 hours.  FINDING OUT THE RESULTS OF YOUR TEST Not all test results are available during your visit. If your test results are not back during the visit, make an appointment with your caregiver to find out the results. Do not assume everything is normal if you have not heard from your  caregiver or the medical facility. It is important for you to follow up on all of your test results.  SEEK IMMEDIATE MEDICAL ATTENTION IF: You have more than a spotting of blood in your stool.  Your belly is swollen (abdominal distention).  You are nauseated or vomiting.  You have a temperature over 101.  You have abdominal pain or discomfort that is severe or gets worse throughout the day.   Your colonoscopy was relatively unremarkable.  I did not find any polyps or evidence of colon cancer.  I recommend repeating colonoscopy in 10 years for colon cancer screening purposes.    Overall, your colon looked very healthy.  I did not see any active inflammation indicative of underlying inflammatory bowel disease.  I did take samples of your colon to check for a condition called microscopic colitis.  Await pathology results, my office will contact you.  Follow-up in GI office in 6 to 8 weeks.   I hope you have a great rest of your week!  Hennie Duos. Marletta Lor, D.O. Gastroenterology and Hepatology [facility]

## 2023-01-21 NOTE — Anesthesia Postprocedure Evaluation (Signed)
Anesthesia Post Note  Patient: Crystal Duffy  Procedure(s) Performed: COLONOSCOPY WITH PROPOFOL BIOPSY  Patient location during evaluation: Phase II Anesthesia Type: General Level of consciousness: awake and alert and oriented Pain management: pain level controlled Vital Signs Assessment: post-procedure vital signs reviewed and stable Respiratory status: spontaneous breathing, nonlabored ventilation and respiratory function stable Cardiovascular status: blood pressure returned to baseline and stable Postop Assessment: no apparent nausea or vomiting Anesthetic complications: no  No notable events documented.   Last Vitals:  Vitals:   01/21/23 1204 01/21/23 1344  BP: (!) 118/58 (!) 93/57  Pulse: 67 90  Resp: 15 18  Temp: 36.6 C 36.4 C  SpO2: 100% 100%    Last Pain:  Vitals:   01/21/23 1344  TempSrc: Oral  PainSc: 0-No pain                 Delaynie Stetzer C Mylin Hirano

## 2023-01-22 LAB — SURGICAL PATHOLOGY

## 2023-01-23 ENCOUNTER — Encounter: Payer: Self-pay | Admitting: Gastroenterology

## 2023-01-28 ENCOUNTER — Encounter (HOSPITAL_COMMUNITY): Payer: Self-pay | Admitting: Internal Medicine

## 2023-03-05 ENCOUNTER — Encounter: Payer: Self-pay | Admitting: Gastroenterology

## 2023-03-05 ENCOUNTER — Ambulatory Visit: Payer: BC Managed Care – PPO | Admitting: Gastroenterology

## 2023-03-05 VITALS — BP 105/60 | HR 54 | Temp 98.2°F | Ht 67.0 in | Wt 142.0 lb

## 2023-03-05 DIAGNOSIS — R194 Change in bowel habit: Secondary | ICD-10-CM | POA: Diagnosis not present

## 2023-03-05 DIAGNOSIS — R195 Other fecal abnormalities: Secondary | ICD-10-CM

## 2023-03-05 NOTE — Progress Notes (Signed)
GI Office Note    Referring Provider: Carylon Perches, MD Primary Care Physician:  Carylon Perches, MD Primary Gastroenterologist: Hennie Duos. Marletta Lor, DO  Date:  03/05/2023  ID:  Crystal Duffy, DOB 10/25/1955, MRN [id number]   Chief Complaint   Chief Complaint  Patient presents with   Follow-up    Follow up after colonoscopy, and follow up on diarrhea. Pt states it is better   History of Present Illness  Crystal Duffy is a 66 y.o. female with a history of anemia and bilateral salpingo-oophorectomy in 2020 presenting today for follow up of looser stools and bloating.   Colonoscopy and EGD in November 2008: -Normal TI and ileocecal valve -Normal colon -Small internal hemorrhoids -Normal esophagus -Gastric atrophy with lack of prominent gastric folds s/p biopsy -Normal duodenum s/p biopsies to evaluate for celiac -Gastric biopsies negative for H. pylori, benign gastric mucosa, benign small bowel mucosa -Advised repeat colonoscopy in 10 years, follow high-fiber diet -Advised to avoid NSAIDs   Labs 11/14/22: TSH within normal limits. ACE normal. A1c 6.   GI profile negative 11/15/22.   Initial office visit 01/07/2023.  Patient reported change in bowel habits recently.  Was having significant urgency with looser/mushy stools described as pudding consistency.  Usually having 3-4 daily in the mornings for several weeks.  Also with some mild bloating.  Previously on probiotic but nothing recently.  Thyroid function under control.  Denied exposure to ticks.  Has been trying to avoid lactose and drinking Lactaid milk and had noticed some improvement.  Scheduled for colonoscopy given change in bowel habits and to assess for microscopic colitis.  Advised to avoid lactose and follow low FODMAP diet.  Colonoscopy 01/21/2023: - Non- bleeding internal hemorrhoids.  - The examined portion of the ileum was normal.  - The examination was otherwise normal.  - Biopsies were taken with a cold forceps from -  Path -negative for microscopic colitis - Repeat in 10 years  Today:  Has a little gas in the morning but within an hour she has 1-2 BM daily and still having some pudding type consistency,   Has been trying a low FODMAP diet. After some fruit and some lactose she did have some change in stools. Has been trying to do some gluten free options. Drinking lactaid milk. Has been trying to avoid coffee creamer and making small changes. Did use to drink a lot of milk. Grew up in [place]. No longer having urgency.   The bloating has been much better since avoiding lactose and gluten she is not really having much pain. Has eaten some broccoli much before.   Not drinking kieffer anymore but still taking probiotics.   Current Outpatient Medications  Medication Sig Dispense Refill   Multiple Vitamins-Minerals (MULTIVITAMIN WITH MINERALS) tablet Take 1 tablet by mouth daily. Woman 40+     Probiotic Product (PROBIOTIC PO) Take 1 capsule by mouth daily. Acidophilus Bifidus     No current facility-administered medications for this visit.    Past Medical History:  Diagnosis Date   Anemia     Past Surgical History:  Procedure Laterality Date   BIOPSY  01/21/2023   Procedure: BIOPSY;  Surgeon: [name], DO;  Location: [facility];  Service: Endoscopy;;   COLONOSCOPY WITH PROPOFOL N/A 01/21/2023   Procedure: COLONOSCOPY WITH PROPOFOL;  Surgeon: [name], DO;  Location: [facility];  Service: Endoscopy;  Laterality: N/A;  1:45 pm, asa 2   DIAGNOSTIC LAPAROSCOPY  ovarian cyst  ENDOMETRIAL ABLATION     LAPAROSCOPIC BILATERAL SALPINGO OOPHERECTOMY Bilateral 04/13/2019   Procedure: LAPAROSCOPIC BILATERAL SALPINGO OOPHORECTOMY;  Surgeon: Tilda Burrow, MD;  Location: [facility];  Service: Gynecology;  Laterality: Bilateral;   OVARIAN CYST REMOVAL Left    2005   TUBAL LIGATION      Family History  Problem Relation Age of Onset   Diabetes Father    Heart failure Sister    Diabetes  Brother    Diabetes Brother    Heart Problems Maternal Grandmother     Allergies as of 03/05/2023 - Review Complete 03/05/2023  Allergen Reaction Noted   Other Other (See Comments) 11/14/2022    Social History   Socioeconomic History   Marital status: Married    Spouse name: Not on file   Number of children: 2   Years of education: Not on file   Highest education level: Master's degree (e.g., MA, MS, MEng, MEd, MSW, MBA)  Occupational History   Not on file  Tobacco Use   Smoking status: Never   Smokeless tobacco: Never  Vaping Use   Vaping status: Never Used  Substance and Sexual Activity   Alcohol use: Not Currently    Alcohol/week: 4.0 standard drinks of alcohol    Types: 2 Glasses of wine, 2 Cans of beer per week    Comment: 3-4 drinks per week   Drug use: No   Sexual activity: Yes    Birth control/protection: Surgical    Comment: tubal and ablation  Other Topics Concern   Not on file  Social History Narrative   Right handed   Caffeine use: Coffee- 1.5 cup each morning, 1.5 cup in the afternoon   Social Determinants of Health   Financial Resource Strain: Not on file  Food Insecurity: Not on file  Transportation Needs: Not on file  Physical Activity: Not on file  Stress: Not on file  Social Connections: Not on file     Review of Systems   Gen: Denies fever, chills, anorexia. Denies fatigue, weakness, weight loss.  CV: Denies chest pain, palpitations, syncope, peripheral edema, and claudication. Resp: Denies dyspnea at rest, cough, wheezing, coughing up blood, and pleurisy. GI: See HPI Derm: Denies rash, itching, dry skin Psych: Denies depression, anxiety, memory loss, confusion. No homicidal or suicidal ideation.  Heme: Denies bruising, bleeding, and enlarged lymph nodes.   Physical Exam   BP 105/60   Pulse (!) 54   Temp 98.2 F (36.8 C)   Ht 5\' 7"  (1.702 m)   Wt 142 lb (64.4 kg)   BMI 22.24 kg/m   General:   Alert and oriented. No distress  noted. Pleasant and cooperative.  Head:  Normocephalic and atraumatic. Eyes:  Conjuctiva clear without scleral icterus. Mouth:  Oral mucosa pink and moist. Good dentition. No lesions. Abdomen:  +BS, soft, non-tender and non-distended. No rebound or guarding. No HSM or masses noted.  Rectal: deferred Msk:  Symmetrical without gross deformities. Normal posture. Extremities:  Without edema. Neurologic:  Alert and  oriented x4 Psych:  Alert and cooperative. Normal mood and affect.   Assessment  Crystal Duffy is a 67 y.o. female with a history of anemia and bilateral salpingo-oophorectomy in 2020 presenting today for follow up of looser stools and bloating.   Loose stools, bloating: Currently only having 1-2 bowel movements per day in the mornings but still described as pudding consistency.  Although consistency is a change, her recent colonoscopy was completely normal without any signs of microscopic  colitis or malignancy which is reassuring.  Suspect this is all primarily dietary related.  She has had significant improvement of bloating since changed to low FODMAP diet and avoidance of lactose.  Has been on probiotic for many years therefore we discussed potentially adding an additional probiotic or changing probiotics altogether to see if this also improves symptoms.  Given her improvement we will avoid any additional testing for alpha gal, SIBO, or fecal elastase.  We certainly could consider these in the future if symptoms were to worsen.  Repeat TCS in 10 years as most recent colonoscopy was normal.   PLAN   Continue low FODMAP Continue to avoid lactose Try differnet probiotc- options given to patient Follow up as needed    Brooke Bonito, MSN, FNP-BC, AGACNP-BC [facility]

## 2023-03-05 NOTE — Patient Instructions (Addendum)
It was great to see you again today!  Continue following the low FODMAP diet and avoiding lactose as much as possible.  If you would like you could consider adding additional probiotic or switching up your current probiotic.  The ones that we discussed in the office today are Maurie Boettcher colon health, or Visbiome.  You can follow up as needed. Feel free to reach out if any new changes in bowels or symptoms begin to worsen.   It was a pleasure to see you today. I want to create trusting relationships with patients. If you receive a survey regarding your visit,  I greatly appreciate you taking time to fill this out on paper or through your MyChart. I value your feedback.  Brooke Bonito, MSN, FNP-BC, AGACNP-BC [facility]

## 2023-07-03 ENCOUNTER — Other Ambulatory Visit (HOSPITAL_COMMUNITY): Payer: Self-pay | Admitting: Internal Medicine

## 2023-07-03 DIAGNOSIS — Z1231 Encounter for screening mammogram for malignant neoplasm of breast: Secondary | ICD-10-CM

## 2023-08-07 ENCOUNTER — Ambulatory Visit (HOSPITAL_COMMUNITY)
Admission: RE | Admit: 2023-08-07 | Discharge: 2023-08-07 | Disposition: A | Discharge status: Home / Self Care | Payer: Medicare Other | Source: Ambulatory Visit | Attending: Internal Medicine | Admitting: Internal Medicine

## 2023-08-07 DIAGNOSIS — Z1231 Encounter for screening mammogram for malignant neoplasm of breast: Secondary | ICD-10-CM | POA: Diagnosis present

## 2024-08-20 ENCOUNTER — Other Ambulatory Visit (HOSPITAL_COMMUNITY): Payer: Self-pay | Admitting: Internal Medicine

## 2024-08-20 DIAGNOSIS — Z1231 Encounter for screening mammogram for malignant neoplasm of breast: Secondary | ICD-10-CM

## 2024-08-20 DIAGNOSIS — Z13820 Encounter for screening for osteoporosis: Secondary | ICD-10-CM

## 2024-08-20 DIAGNOSIS — M818 Other osteoporosis without current pathological fracture: Secondary | ICD-10-CM

## 2024-09-01 ENCOUNTER — Ambulatory Visit (HOSPITAL_COMMUNITY)
Admission: RE | Admit: 2024-09-01 | Discharge: 2024-09-01 | Disposition: A | Discharge status: Home / Self Care | Source: Ambulatory Visit | Attending: Internal Medicine | Admitting: Internal Medicine

## 2024-09-01 ENCOUNTER — Encounter (HOSPITAL_COMMUNITY): Payer: Self-pay

## 2024-09-01 ENCOUNTER — Ambulatory Visit (HOSPITAL_COMMUNITY)
Admission: RE | Admit: 2024-09-01 | Discharge: 2024-09-01 | Disposition: A | Source: Ambulatory Visit | Attending: Internal Medicine | Admitting: Internal Medicine

## 2024-09-01 DIAGNOSIS — Z1231 Encounter for screening mammogram for malignant neoplasm of breast: Secondary | ICD-10-CM | POA: Insufficient documentation

## 2024-09-01 DIAGNOSIS — Z13820 Encounter for screening for osteoporosis: Secondary | ICD-10-CM

## 2024-09-01 DIAGNOSIS — M818 Other osteoporosis without current pathological fracture: Secondary | ICD-10-CM | POA: Diagnosis present

## 2024-09-21 ENCOUNTER — Institutional Professional Consult (permissible substitution) (INDEPENDENT_AMBULATORY_CARE_PROVIDER_SITE_OTHER): Admitting: Otolaryngology

## 2024-10-05 ENCOUNTER — Institutional Professional Consult (permissible substitution) (INDEPENDENT_AMBULATORY_CARE_PROVIDER_SITE_OTHER): Admitting: Otolaryngology
